# Patient Record
Sex: Female | Born: 1980 | Race: White | Hispanic: No | Marital: Married | Smoking: Current every day smoker
Health system: Southern US, Community
[De-identification: ages and names within clinical notes are randomized; demographics above are authoritative.]

## PROBLEM LIST (undated history)

## (undated) DIAGNOSIS — E119 Type 2 diabetes mellitus without complications: Secondary | ICD-10-CM

## (undated) DIAGNOSIS — R609 Edema, unspecified: Secondary | ICD-10-CM

## (undated) DIAGNOSIS — R5383 Other fatigue: Secondary | ICD-10-CM

## (undated) HISTORY — PX: OTHER SURGICAL HISTORY: SHX169

## (undated) HISTORY — DX: Edema, unspecified: R60.9

## (undated) HISTORY — DX: Other fatigue: R53.83

## (undated) HISTORY — PX: CHOLECYSTECTOMY: SHX55

## (undated) HISTORY — DX: Type 2 diabetes mellitus without complications: E11.9

## (undated) HISTORY — PX: HERNIA REPAIR: SHX51

---

## 2009-03-03 HISTORY — PX: MASTECTOMY: SHX3

## 2010-03-03 HISTORY — PX: REDUCTION MAMMAPLASTY: SUR839

## 2013-10-20 ENCOUNTER — Ambulatory Visit: Payer: BC Managed Care – HMO

## 2013-10-20 ENCOUNTER — Ambulatory Visit (INDEPENDENT_AMBULATORY_CARE_PROVIDER_SITE_OTHER): Payer: BC Managed Care – HMO

## 2013-10-20 VITALS — BP 119/84 | HR 88 | Resp 18

## 2013-10-20 DIAGNOSIS — M7731 Calcaneal spur, right foot: Secondary | ICD-10-CM

## 2013-10-20 DIAGNOSIS — M722 Plantar fascial fibromatosis: Secondary | ICD-10-CM

## 2013-10-20 DIAGNOSIS — M7730 Calcaneal spur, unspecified foot: Secondary | ICD-10-CM

## 2013-10-20 DIAGNOSIS — R52 Pain, unspecified: Secondary | ICD-10-CM

## 2013-10-20 NOTE — Progress Notes (Signed)
   Subjective:    Patient ID: Candace Edwards, female    DOB: 04/15/1980, 33 y.o.   MRN: [id number]  HPI I AM A DIABETIC AND HAVE BEEN SINCE January OF THIS YEAR AND MY RIGHT HEEL IS HURTING ME WHEN I GET UP AND HAS BEEN GOING ON SINCE MAY AND I DO WALK AND STARTED RUNNING AND BURNS AND THROBS AND HURTS ON THE BOTTOM AND IS SORE AND TENDER AND I GOT NEW SHOES    Review of Systems  Musculoskeletal:       Muscle pain   All other systems reviewed and are negative.      Objective:   Physical Exam [age] year old white female well-developed well-nourished oriented x3 presents at this time with a several month history of increasing pain inferior heel pain on first up in the morning or getting up a period of rest and aggravated when she started an exercise program and worsen worn out athletic shoes.  Lower extremity objective findings revealed vascular status intact pedal pulses are palpable epicritic and proprioceptive sensations intact and symmetric bilateral is normal plantar response DTRs not listed neurologically skin color pigment normal hair growth absent nails unremarkable. On exam there is pain on palpation of the medial band of the plantar fascia medial calcaneal tubercle on the right heel x-rays reveal thickening plantar fascia well-developed inferior calcaneal spur no fracture no cysts no other osseous abnormality noted      Assessment & Plan:  Assessment plantar fasciitis/heel spur syndrome right plantar fascial strapping is applied patient will continue with the meloxicam her MOBIC which she's currently take also recommended ice to the heel every evening maintain good stable shoe suggested crocs for around the house no barefoot or flimsy shoes or flip-flops recheck in 2 weeks for followup may be candidate for functional orthoses based on progress  Candace Edwards DPM

## 2013-10-20 NOTE — Patient Instructions (Signed)

## 2013-11-03 ENCOUNTER — Ambulatory Visit (INDEPENDENT_AMBULATORY_CARE_PROVIDER_SITE_OTHER): Payer: BC Managed Care – HMO

## 2013-11-03 VITALS — BP 112/77 | HR 94 | Resp 18

## 2013-11-03 DIAGNOSIS — M7730 Calcaneal spur, unspecified foot: Secondary | ICD-10-CM

## 2013-11-03 DIAGNOSIS — M7731 Calcaneal spur, right foot: Secondary | ICD-10-CM

## 2013-11-03 DIAGNOSIS — M722 Plantar fascial fibromatosis: Secondary | ICD-10-CM

## 2013-11-03 NOTE — Patient Instructions (Signed)

## 2013-11-03 NOTE — Progress Notes (Signed)
° °  Subjective:    Patient ID: Candace Edwards, female    DOB: 04/20/1980, 33 y.o.   MRN: [id number]  HPI I WAS BETTER WHEN MY RIGHT FOOT WAS TAPED AND THEN WHEN IT CAME OFF IT WENT BACK TO BEING SORE AND TENDER AND IF I HAVE INSERTS MADE I WOULD LIKE TO HAVE THEM MADE AT [facility] IN [place]    Review of Systems no new findings or systemic changes noted     Objective:   Physical Exam Continues to have pain in the inferior heel area and better with taping was in place it was taken back to hurting painful tender and symptomatic like it had been. Has had to cut back on her exercise program her basal effective the taping has significant improvement on place she is strong K. for orthoses at this time prescription for orthotics for [place] [facility] is given to the patient will do a sports [employer]fer plate type orthotic with Spenco top cover for distention and extrinsic heel post.      Assessment & Plan:  Assessment plantar fasciitis/heel spur syndrome responded to taping orthotic prescription is given fascial strapping reapplied at this time for temporary relief maintain NSAID therapy in the interim followup with in 2 or 3 months if still needed or symptoms persist  Alvan Dame DPM

## 2013-11-15 ENCOUNTER — Telehealth: Payer: Self-pay | Admitting: *Deleted

## 2013-11-15 NOTE — Telephone Encounter (Signed)
Sent an order to be signed for foot inserts, removable, molded to patient model, longitudinal/ metatarsal support, each.  Length of time is lifetime.  Diagnosis is Plantar Fascial Fibromatosis.  The code is L3020.    Dr. Ralene Cork signed the order and it was faxed back.

## 2015-05-31 ENCOUNTER — Encounter: Payer: Self-pay | Admitting: Sports Medicine

## 2015-05-31 ENCOUNTER — Ambulatory Visit (INDEPENDENT_AMBULATORY_CARE_PROVIDER_SITE_OTHER): Payer: BLUE CROSS/BLUE SHIELD | Admitting: Sports Medicine

## 2015-05-31 DIAGNOSIS — M79671 Pain in right foot: Secondary | ICD-10-CM

## 2015-05-31 DIAGNOSIS — E119 Type 2 diabetes mellitus without complications: Secondary | ICD-10-CM

## 2015-05-31 DIAGNOSIS — Q828 Other specified congenital malformations of skin: Secondary | ICD-10-CM

## 2015-05-31 NOTE — Patient Instructions (Addendum)
Diabetes and Foot Care Diabetes may cause you to have problems because of poor blood supply (circulation) to your feet and legs. This may cause the skin on your feet to become thinner, break easier, and heal more slowly. Your skin may become dry, and the skin may peel and crack. You may also have nerve damage in your legs and feet causing decreased feeling in them. You may not notice minor injuries to your feet that could lead to infections or more serious problems. Taking care of your feet is one of the most important things you can do for yourself.  HOME CARE INSTRUCTIONS  Wear shoes at all times, even in the house. Do not go barefoot. Bare feet are easily injured.  Check your feet daily for blisters, cuts, and redness. If you cannot see the bottom of your feet, use a mirror or ask someone for help.  Wash your feet with warm water (do not use hot water) and mild soap. Then pat your feet and the areas between your toes until they are completely dry. Do not soak your feet as this can dry your skin.  Apply a moisturizing lotion or petroleum jelly (that does not contain alcohol and is unscented) to the skin on your feet and to dry, brittle toenails. Do not apply lotion between your toes.  Trim your toenails straight across. Do not dig under them or around the cuticle. File the edges of your nails with an emery board or nail file.  Do not cut corns or calluses or try to remove them with medicine.  Wear clean socks or stockings every day. Make sure they are not too tight. Do not wear knee-high stockings since they may decrease blood flow to your legs.  Wear shoes that fit properly and have enough cushioning. To break in new shoes, wear them for just a few hours a day. This prevents you from injuring your feet. Always look in your shoes before you put them on to be sure there are no objects inside.  Do not cross your legs. This may decrease the blood flow to your feet.  If you find a minor scrape,  cut, or break in the skin on your feet, keep it and the skin around it clean and dry. These areas may be cleansed with mild soap and water. Do not cleanse the area with peroxide, alcohol, or iodine.  When you remove an adhesive bandage, be sure not to damage the skin around it.  If you have a wound, look at it several times a day to make sure it is healing.  Do not use heating pads or hot water bottles. They may burn your skin. If you have lost feeling in your feet or legs, you may not know it is happening until it is too late.  Make sure your health care provider performs a complete foot exam at least annually or more often if you have foot problems. Report any cuts, sores, or bruises to your health care provider immediately. SEEK MEDICAL CARE IF:   You have an injury that is not healing.  You have cuts or breaks in the skin.  You have an ingrown nail.  You notice redness on your legs or feet.  You feel burning or tingling in your legs or feet.  You have pain or cramps in your legs and feet.  Your legs or feet are numb.  Your feet always feel cold. SEEK IMMEDIATE MEDICAL CARE IF:   There is increasing redness,   swelling, or pain in or around a wound.  There is a red line that goes up your leg.  Pus is coming from a wound.  You develop a fever or as directed by your health care provider.  You notice a bad smell coming from an ulcer or wound.   This information is not intended to replace advice given to you by your health care provider. Make sure you discuss any questions you have with your health care provider.   Document Released: 02/15/2000 Document Revised: 10/20/2012 Document Reviewed: 07/27/2012 Elsevier Interactive Patient Education 2016 [employer]  Okeeffe's healthy feet cream daily for dry skin  Good supportive shoes daily

## 2015-05-31 NOTE — Progress Notes (Signed)
Patient ID: Candace Edwards, female   DOB: 10/07/1980, 35 y.o.   MRN: [id number] Subjective: Candace Edwards is a 35 y.o. Diabetic female patient who presents to office for evaluation of Right foot pain. Patient complains of pain at the lesion present Right foot at the bottom; reports that the pain is sharp to the area and then also throbbing. States that she had similar lesion last year that was shaved by Dr. Ralene CorkSikora. Admits that she tried trimming it herself but it came back. Patient denies any other pedal complaints.   FBS ~120  There are no active problems to display for this patient.   Current Outpatient Prescriptions on File Prior to Visit  Medication Sig Dispense Refill  . azithromycin (ZITHROMAX) 250 MG tablet     . docusate sodium (COLACE) 100 MG capsule Take 100 mg by mouth 2 (two) times daily.    . meloxicam (MOBIC) 15 MG tablet Take 15 mg by mouth daily.    . metFORMIN (GLUCOPHAGE) 1000 MG tablet Take 1,000 mg by mouth 2 (two) times daily with a meal.    . Multiple Vitamin (MULTIVITAMIN) tablet Take 1 tablet by mouth daily. womens daily/lc    . phentermine (ADIPEX-P) 37.5 MG tablet Take 37.5 mg by mouth daily before breakfast.    . Vitamin D, Ergocalciferol, (DRISDOL) 50000 UNITS CAPS capsule Take 50,000 Units by mouth every 7 (seven) days.     No current facility-administered medications on file prior to visit.    Allergies  Allergen Reactions  . Amoxicillin   . Erythromycin   . Levaquin [Levofloxacin In D5w]   . Maxiphen [Phenylephrine-Guaifenesin]   . Penicillins     Objective:  General: Alert and oriented x3 in no acute distress  Dermatology: Keratotic lesion present right plantar lateral foot with central nucleated core noted suggestive of porokeratosis with superimposed dry skin to plantar surfaces of both feet, no webspace macerations, no ecchymosis bilateral, all nails x 10 are well manicured.  Vascular: Dorsalis Pedis and Posterior Tibial pedal pulses 2/4,  Capillary Fill Time 3 seconds, + pedal hair growth bilateral, no edema bilateral lower extremities, Temperature gradient within normal limits.  Neurology: Michaell CowingGross sensation intact via light touch bilateral. Protective and vibratory sensation intact bilateral.  Musculoskeletal: Mild tenderness with palpation at the lesion site on Right, Muscular strength 5/5 in all groups without pain or limitation on range of motion. No symptomatic lower extremity muscular or boney deformity noted.  Assessment and Plan: Problem List Items Addressed This Visit    None    Visit Diagnoses    Porokeratosis    -  Primary    Right foot pain        Diabetes mellitus without complication (HCC)          -Complete examination performed -Discussed treatment options -Parred keratoic lesion using a chisel blade; treated the area with Salinocaine covered with moleskin; Advised patient to remove in one day -Encouraged daily foot inspection and to refrain from using sharp objects to trim on feet in the setting of diabetes -Recommend daily skin emollients O'Keeffes healthy feet -Recommend good supportive shoes and inserts with offloading to the area as needed -Patient to return to office as needed or sooner if condition worsens.  Asencion Islamitorya Flois Mctague, DPM

## 2015-06-10 DIAGNOSIS — J209 Acute bronchitis, unspecified: Secondary | ICD-10-CM | POA: Diagnosis not present

## 2015-06-10 DIAGNOSIS — Z9049 Acquired absence of other specified parts of digestive tract: Secondary | ICD-10-CM | POA: Diagnosis not present

## 2015-06-10 DIAGNOSIS — Z9010 Acquired absence of unspecified breast and nipple: Secondary | ICD-10-CM | POA: Diagnosis not present

## 2015-06-10 DIAGNOSIS — N611 Abscess of the breast and nipple: Secondary | ICD-10-CM | POA: Diagnosis not present

## 2015-06-10 DIAGNOSIS — Z853 Personal history of malignant neoplasm of breast: Secondary | ICD-10-CM | POA: Diagnosis not present

## 2015-06-10 DIAGNOSIS — Z720 Tobacco use: Secondary | ICD-10-CM | POA: Diagnosis not present

## 2015-06-10 DIAGNOSIS — Z880 Allergy status to penicillin: Secondary | ICD-10-CM | POA: Diagnosis not present

## 2015-06-10 DIAGNOSIS — Z79899 Other long term (current) drug therapy: Secondary | ICD-10-CM | POA: Diagnosis not present

## 2015-06-10 DIAGNOSIS — R05 Cough: Secondary | ICD-10-CM | POA: Diagnosis not present

## 2015-06-25 DIAGNOSIS — Z713 Dietary counseling and surveillance: Secondary | ICD-10-CM | POA: Diagnosis not present

## 2015-06-25 DIAGNOSIS — F329 Major depressive disorder, single episode, unspecified: Secondary | ICD-10-CM | POA: Diagnosis not present

## 2015-06-25 DIAGNOSIS — E1165 Type 2 diabetes mellitus with hyperglycemia: Secondary | ICD-10-CM | POA: Diagnosis not present

## 2015-06-25 DIAGNOSIS — B373 Candidiasis of vulva and vagina: Secondary | ICD-10-CM | POA: Diagnosis not present

## 2015-07-10 DIAGNOSIS — D72829 Elevated white blood cell count, unspecified: Secondary | ICD-10-CM | POA: Diagnosis not present

## 2015-07-10 DIAGNOSIS — E782 Mixed hyperlipidemia: Secondary | ICD-10-CM | POA: Diagnosis not present

## 2015-07-10 DIAGNOSIS — E1165 Type 2 diabetes mellitus with hyperglycemia: Secondary | ICD-10-CM | POA: Diagnosis not present

## 2015-07-25 ENCOUNTER — Ambulatory Visit (INDEPENDENT_AMBULATORY_CARE_PROVIDER_SITE_OTHER): Payer: BLUE CROSS/BLUE SHIELD | Admitting: Sports Medicine

## 2015-07-25 ENCOUNTER — Other Ambulatory Visit: Payer: Self-pay | Admitting: Sports Medicine

## 2015-07-25 ENCOUNTER — Encounter: Payer: Self-pay | Admitting: Sports Medicine

## 2015-07-25 DIAGNOSIS — B078 Other viral warts: Secondary | ICD-10-CM | POA: Diagnosis not present

## 2015-07-25 DIAGNOSIS — Q828 Other specified congenital malformations of skin: Secondary | ICD-10-CM

## 2015-07-25 DIAGNOSIS — E119 Type 2 diabetes mellitus without complications: Secondary | ICD-10-CM | POA: Diagnosis not present

## 2015-07-25 DIAGNOSIS — M79671 Pain in right foot: Secondary | ICD-10-CM | POA: Diagnosis not present

## 2015-07-25 NOTE — Progress Notes (Signed)
Patient ID: Candace Edwards, female   DOB: 10/23/1980, 35 y.o.   MRN: [id number]   Subjective: Candace Edwards is a 35 y.o. Diabetic female patient who returns to office for evaluation of Right foot pain secondary to keratosis. Patient states it came back worse. Only got 1 week of relief after last trim. Patient denies any other pedal complaints.   FBS ~120. A1c to be obtained  There are no active problems to display for this patient.   Current Outpatient Prescriptions on File Prior to Visit  Medication Sig Dispense Refill  . azithromycin (ZITHROMAX) 250 MG tablet     . docusate sodium (COLACE) 100 MG capsule Take 100 mg by mouth 2 (two) times daily.    . meloxicam (MOBIC) 15 MG tablet Take 15 mg by mouth daily.    . metFORMIN (GLUCOPHAGE) 1000 MG tablet Take 1,000 mg by mouth 2 (two) times daily with a meal.    . Multiple Vitamin (MULTIVITAMIN) tablet Take 1 tablet by mouth daily. womens daily/lc    . phentermine (ADIPEX-P) 37.5 MG tablet Take 37.5 mg by mouth daily before breakfast.    . Vitamin D, Ergocalciferol, (DRISDOL) 50000 UNITS CAPS capsule Take 50,000 Units by mouth every 7 (seven) days.     No current facility-administered medications on file prior to visit.    Allergies  Allergen Reactions  . Amoxicillin   . Erythromycin   . Levaquin [Levofloxacin In D5w]   . Maxiphen [Phenylephrine-Guaifenesin]   . Penicillins     Objective:  General: Alert and oriented x3 in no acute distress  Dermatology: Keratotic lesion measuring 0.8cm in diameter present right plantar lateral foot with central nucleated core noted suggestive of porokeratosis with superimposed dry skin to plantar surfaces of both feet, no webspace macerations, no ecchymosis bilateral, all nails x 10 are well manicured.  Vascular: Dorsalis Pedis and Posterior Tibial pedal pulses 2/4, Capillary Fill Time 3 seconds, + pedal hair growth bilateral, no edema bilateral lower extremities, Temperature gradient within  normal limits.  Neurology: Michaell CowingGross sensation intact via light touch bilateral. Protective and vibratory sensation intact bilateral.  Musculoskeletal: Moderate tenderness with palpation at the lesion site on Right, Muscular strength 5/5 in all groups without pain or limitation on range of motion. No symptomatic lower extremity muscular or boney deformity noted. Cavovarus foot type bilateral  Assessment and Plan: Problem List Items Addressed This Visit    None    Visit Diagnoses    Right foot pain    -  Primary    Porokeratosis        Diabetes mellitus without complication (HCC)          -Complete examination performed -Discussed treatment options -Patient opt for complete excision. After oral consent 3cc mixture of lidocaine and marcaine was infiltrated at right plantar foot keratosis site then 1cc of lidocane with Epi was used, the area was prepped with betadine and the lesion was sharply excised with 15 blade and sent to pathology ([facility]). Hemostasis was achieved and the area was dressed with topical antibiotic cream, offloading pad, 4x4, coban. -Dispensed post op shoe -Recommend ice, elevation, and Motrin or Aleve for pain -Patient to change dressing starting tomorrow after cleanse/ soak with Epsom salt using neosporin and large bandaid -Advised patient to monitor for signs of infection. If occurs to call or come to office or go to ER immediately -Patient to return to office 1 week check excision site or sooner if condition worsens.  Asencion Islamitorya Triton Heidrich, DPM

## 2015-07-29 DIAGNOSIS — T162XXA Foreign body in left ear, initial encounter: Secondary | ICD-10-CM | POA: Diagnosis not present

## 2015-07-29 DIAGNOSIS — Z79899 Other long term (current) drug therapy: Secondary | ICD-10-CM | POA: Diagnosis not present

## 2015-07-29 DIAGNOSIS — H9202 Otalgia, left ear: Secondary | ICD-10-CM | POA: Diagnosis not present

## 2015-07-29 DIAGNOSIS — E119 Type 2 diabetes mellitus without complications: Secondary | ICD-10-CM | POA: Diagnosis not present

## 2015-07-29 DIAGNOSIS — Z7984 Long term (current) use of oral hypoglycemic drugs: Secondary | ICD-10-CM | POA: Diagnosis not present

## 2015-07-29 DIAGNOSIS — Z720 Tobacco use: Secondary | ICD-10-CM | POA: Diagnosis not present

## 2015-07-29 DIAGNOSIS — Z853 Personal history of malignant neoplasm of breast: Secondary | ICD-10-CM | POA: Diagnosis not present

## 2015-08-01 ENCOUNTER — Encounter: Payer: Self-pay | Admitting: Sports Medicine

## 2015-08-01 ENCOUNTER — Ambulatory Visit (INDEPENDENT_AMBULATORY_CARE_PROVIDER_SITE_OTHER): Payer: BLUE CROSS/BLUE SHIELD | Admitting: Sports Medicine

## 2015-08-01 DIAGNOSIS — Z09 Encounter for follow-up examination after completed treatment for conditions other than malignant neoplasm: Secondary | ICD-10-CM

## 2015-08-01 DIAGNOSIS — M79671 Pain in right foot: Secondary | ICD-10-CM

## 2015-08-01 NOTE — Progress Notes (Signed)
Patient ID: Candace Edwards, female   DOB: 11/10/1980, 35 y.o.   MRN: [id number]  Subjective: Candace Edwards is a 35 y.o. Diabetic female patient who returns to office for follow up eval s/p excision of keratosis right foot; reports that she has been dressing area with antibiotic cream and soaking with Epsom salt as instructed. Patient denies any other pedal complaints.   FBS ~120. A1C to be obtained  There are no active problems to display for this patient.   Current Outpatient Prescriptions on File Prior to Visit  Medication Sig Dispense Refill  . azithromycin (ZITHROMAX) 250 MG tablet     . docusate sodium (COLACE) 100 MG capsule Take 100 mg by mouth 2 (two) times daily.    . meloxicam (MOBIC) 15 MG tablet Take 15 mg by mouth daily.    . metFORMIN (GLUCOPHAGE) 1000 MG tablet Take 1,000 mg by mouth 2 (two) times daily with a meal.    . Multiple Vitamin (MULTIVITAMIN) tablet Take 1 tablet by mouth daily. womens daily/lc    . phentermine (ADIPEX-P) 37.5 MG tablet Take 37.5 mg by mouth daily before breakfast.    . Vitamin D, Ergocalciferol, (DRISDOL) 50000 UNITS CAPS capsule Take 50,000 Units by mouth every 7 (seven) days.     No current facility-administered medications on file prior to visit.    Allergies  Allergen Reactions  . Amoxicillin   . Erythromycin   . Levaquin [Levofloxacin In D5w]   . Maxiphen [Phenylephrine-Guaifenesin]   . Penicillins     Objective:  General: Alert and oriented x3 in no acute distress  Dermatology: Excision site right plantar lateral foot Healing with granular tissue present. Mild surrounding maceration, no erythema, no cellulitis, no edema, no active drainage, no malodor, no acute signs of infection, no webspace macerations, no ecchymosis bilateral, all nails x 10 are well manicured.  Vascular: Dorsalis Pedis and Posterior Tibial pedal pulses 2/4, Capillary Fill Time 3 seconds, + pedal hair growth bilateral, no edema bilateral lower extremities,  Temperature gradient within normal limits.  Neurology: Michaell CowingGross sensation intact via light touch bilateral. Protective and vibratory sensation intact bilateral.  Musculoskeletal: Decreased tenderness with palpation at excision site right foot, Muscular strength 5/5 in all groups without pain or limitation on range of motion. No symptomatic lower extremity muscular or boney deformity noted. Cavovarus foot type bilateral  Path, [facility]   Assessment and Plan: Problem List Items Addressed This Visit    None    Visit Diagnoses    S/P excision of skin lesion, follow-up exam    -  Primary    Right foot pain          -Complete examination performed -Continue with daily foot inspection in the setting of diabetes -Pathology reviewed -Using a sterile chisel blade mechanically removed reactive keratosis and excision site right plantar lateral foot and applied Neosporin and Band-Aid -Patient to continue with the dressing excision site with Neosporin and Band-Aid may be open to air at night -May use offloading pad until sensitivity at excision site is resolved -Recommend to wean from Epsom salt soaks as instructed -Patient to return to office 4 weeks re-check excision site or sooner if condition worsens.  Asencion Islamitorya Zalan Shidler, DPM

## 2015-08-11 DIAGNOSIS — Z7984 Long term (current) use of oral hypoglycemic drugs: Secondary | ICD-10-CM | POA: Diagnosis not present

## 2015-08-11 DIAGNOSIS — L02213 Cutaneous abscess of chest wall: Secondary | ICD-10-CM | POA: Diagnosis not present

## 2015-08-11 DIAGNOSIS — Z6841 Body Mass Index (BMI) 40.0 and over, adult: Secondary | ICD-10-CM | POA: Diagnosis not present

## 2015-08-11 DIAGNOSIS — N611 Abscess of the breast and nipple: Secondary | ICD-10-CM | POA: Diagnosis not present

## 2015-08-11 DIAGNOSIS — Z853 Personal history of malignant neoplasm of breast: Secondary | ICD-10-CM | POA: Diagnosis not present

## 2015-08-11 DIAGNOSIS — Z79899 Other long term (current) drug therapy: Secondary | ICD-10-CM | POA: Diagnosis not present

## 2015-08-11 DIAGNOSIS — E669 Obesity, unspecified: Secondary | ICD-10-CM | POA: Diagnosis not present

## 2015-08-11 DIAGNOSIS — E119 Type 2 diabetes mellitus without complications: Secondary | ICD-10-CM | POA: Diagnosis not present

## 2015-08-11 DIAGNOSIS — F419 Anxiety disorder, unspecified: Secondary | ICD-10-CM | POA: Diagnosis not present

## 2015-08-29 ENCOUNTER — Ambulatory Visit: Payer: BLUE CROSS/BLUE SHIELD | Admitting: Sports Medicine

## 2015-09-03 ENCOUNTER — Encounter: Payer: Self-pay | Admitting: Sports Medicine

## 2015-10-03 DIAGNOSIS — D72829 Elevated white blood cell count, unspecified: Secondary | ICD-10-CM | POA: Diagnosis not present

## 2015-10-03 DIAGNOSIS — E1165 Type 2 diabetes mellitus with hyperglycemia: Secondary | ICD-10-CM | POA: Diagnosis not present

## 2015-10-03 DIAGNOSIS — F329 Major depressive disorder, single episode, unspecified: Secondary | ICD-10-CM | POA: Diagnosis not present

## 2015-10-03 DIAGNOSIS — E782 Mixed hyperlipidemia: Secondary | ICD-10-CM | POA: Diagnosis not present

## 2015-10-03 DIAGNOSIS — R609 Edema, unspecified: Secondary | ICD-10-CM | POA: Diagnosis not present

## 2015-10-22 DIAGNOSIS — K219 Gastro-esophageal reflux disease without esophagitis: Secondary | ICD-10-CM | POA: Diagnosis not present

## 2015-10-22 DIAGNOSIS — C50919 Malignant neoplasm of unspecified site of unspecified female breast: Secondary | ICD-10-CM | POA: Diagnosis not present

## 2015-10-22 DIAGNOSIS — R1031 Right lower quadrant pain: Secondary | ICD-10-CM | POA: Diagnosis not present

## 2015-10-22 DIAGNOSIS — E119 Type 2 diabetes mellitus without complications: Secondary | ICD-10-CM | POA: Diagnosis not present

## 2015-10-22 DIAGNOSIS — K37 Unspecified appendicitis: Secondary | ICD-10-CM | POA: Diagnosis not present

## 2015-10-22 DIAGNOSIS — K3580 Unspecified acute appendicitis: Secondary | ICD-10-CM | POA: Diagnosis not present

## 2015-10-23 DIAGNOSIS — K37 Unspecified appendicitis: Secondary | ICD-10-CM | POA: Diagnosis not present

## 2015-10-23 DIAGNOSIS — K219 Gastro-esophageal reflux disease without esophagitis: Secondary | ICD-10-CM | POA: Diagnosis not present

## 2015-10-23 DIAGNOSIS — Z79899 Other long term (current) drug therapy: Secondary | ICD-10-CM | POA: Diagnosis not present

## 2015-10-23 DIAGNOSIS — E119 Type 2 diabetes mellitus without complications: Secondary | ICD-10-CM | POA: Diagnosis not present

## 2015-10-23 DIAGNOSIS — K353 Acute appendicitis with localized peritonitis, without perforation or gangrene: Secondary | ICD-10-CM | POA: Insufficient documentation

## 2015-10-23 DIAGNOSIS — I4510 Unspecified right bundle-branch block: Secondary | ICD-10-CM | POA: Diagnosis not present

## 2015-10-23 DIAGNOSIS — Z853 Personal history of malignant neoplasm of breast: Secondary | ICD-10-CM | POA: Diagnosis not present

## 2015-10-23 DIAGNOSIS — K3580 Unspecified acute appendicitis: Secondary | ICD-10-CM | POA: Diagnosis not present

## 2015-10-23 DIAGNOSIS — R9431 Abnormal electrocardiogram [ECG] [EKG]: Secondary | ICD-10-CM | POA: Diagnosis not present

## 2015-10-23 DIAGNOSIS — R1031 Right lower quadrant pain: Secondary | ICD-10-CM | POA: Diagnosis not present

## 2015-10-23 DIAGNOSIS — Z7984 Long term (current) use of oral hypoglycemic drugs: Secondary | ICD-10-CM | POA: Diagnosis not present

## 2015-10-24 DIAGNOSIS — K353 Acute appendicitis with localized peritonitis: Secondary | ICD-10-CM | POA: Diagnosis not present

## 2015-10-24 DIAGNOSIS — K219 Gastro-esophageal reflux disease without esophagitis: Secondary | ICD-10-CM | POA: Diagnosis not present

## 2015-10-24 DIAGNOSIS — Z7984 Long term (current) use of oral hypoglycemic drugs: Secondary | ICD-10-CM | POA: Diagnosis not present

## 2015-10-24 DIAGNOSIS — Z853 Personal history of malignant neoplasm of breast: Secondary | ICD-10-CM | POA: Diagnosis not present

## 2015-10-24 DIAGNOSIS — Z79899 Other long term (current) drug therapy: Secondary | ICD-10-CM | POA: Diagnosis not present

## 2015-10-24 DIAGNOSIS — E119 Type 2 diabetes mellitus without complications: Secondary | ICD-10-CM | POA: Diagnosis not present

## 2015-11-09 DIAGNOSIS — K219 Gastro-esophageal reflux disease without esophagitis: Secondary | ICD-10-CM | POA: Diagnosis not present

## 2015-11-09 DIAGNOSIS — E1165 Type 2 diabetes mellitus with hyperglycemia: Secondary | ICD-10-CM | POA: Diagnosis not present

## 2015-11-09 DIAGNOSIS — G4730 Sleep apnea, unspecified: Secondary | ICD-10-CM | POA: Diagnosis not present

## 2015-11-09 DIAGNOSIS — F329 Major depressive disorder, single episode, unspecified: Secondary | ICD-10-CM | POA: Diagnosis not present

## 2015-11-27 DIAGNOSIS — G478 Other sleep disorders: Secondary | ICD-10-CM | POA: Diagnosis not present

## 2015-11-27 DIAGNOSIS — G4733 Obstructive sleep apnea (adult) (pediatric): Secondary | ICD-10-CM | POA: Diagnosis not present

## 2015-11-30 DIAGNOSIS — F329 Major depressive disorder, single episode, unspecified: Secondary | ICD-10-CM | POA: Diagnosis not present

## 2015-11-30 DIAGNOSIS — J40 Bronchitis, not specified as acute or chronic: Secondary | ICD-10-CM | POA: Diagnosis not present

## 2015-12-12 DIAGNOSIS — G47419 Narcolepsy without cataplexy: Secondary | ICD-10-CM | POA: Diagnosis not present

## 2015-12-21 DIAGNOSIS — E1165 Type 2 diabetes mellitus with hyperglycemia: Secondary | ICD-10-CM | POA: Diagnosis not present

## 2015-12-21 DIAGNOSIS — D72829 Elevated white blood cell count, unspecified: Secondary | ICD-10-CM | POA: Diagnosis not present

## 2015-12-21 DIAGNOSIS — F329 Major depressive disorder, single episode, unspecified: Secondary | ICD-10-CM | POA: Diagnosis not present

## 2015-12-21 DIAGNOSIS — E782 Mixed hyperlipidemia: Secondary | ICD-10-CM | POA: Diagnosis not present

## 2016-01-11 DIAGNOSIS — Z1231 Encounter for screening mammogram for malignant neoplasm of breast: Secondary | ICD-10-CM | POA: Diagnosis not present

## 2016-01-17 DIAGNOSIS — E1165 Type 2 diabetes mellitus with hyperglycemia: Secondary | ICD-10-CM | POA: Diagnosis not present

## 2016-01-17 DIAGNOSIS — E782 Mixed hyperlipidemia: Secondary | ICD-10-CM | POA: Diagnosis not present

## 2016-01-17 DIAGNOSIS — D72829 Elevated white blood cell count, unspecified: Secondary | ICD-10-CM | POA: Diagnosis not present

## 2016-01-17 DIAGNOSIS — Z209 Contact with and (suspected) exposure to unspecified communicable disease: Secondary | ICD-10-CM | POA: Diagnosis not present

## 2016-01-29 DIAGNOSIS — E782 Mixed hyperlipidemia: Secondary | ICD-10-CM | POA: Diagnosis not present

## 2016-01-29 DIAGNOSIS — E1165 Type 2 diabetes mellitus with hyperglycemia: Secondary | ICD-10-CM | POA: Diagnosis not present

## 2016-01-29 DIAGNOSIS — Z209 Contact with and (suspected) exposure to unspecified communicable disease: Secondary | ICD-10-CM | POA: Diagnosis not present

## 2016-01-29 DIAGNOSIS — A530 Latent syphilis, unspecified as early or late: Secondary | ICD-10-CM | POA: Diagnosis not present

## 2016-02-13 DIAGNOSIS — C50911 Malignant neoplasm of unspecified site of right female breast: Secondary | ICD-10-CM | POA: Diagnosis not present

## 2016-02-19 DIAGNOSIS — E1165 Type 2 diabetes mellitus with hyperglycemia: Secondary | ICD-10-CM | POA: Diagnosis not present

## 2016-02-19 DIAGNOSIS — Z6841 Body Mass Index (BMI) 40.0 and over, adult: Secondary | ICD-10-CM | POA: Diagnosis not present

## 2016-02-19 DIAGNOSIS — Z0000 Encounter for general adult medical examination without abnormal findings: Secondary | ICD-10-CM | POA: Diagnosis not present

## 2016-03-05 DIAGNOSIS — Z6841 Body Mass Index (BMI) 40.0 and over, adult: Secondary | ICD-10-CM | POA: Diagnosis not present

## 2016-03-05 DIAGNOSIS — Z01419 Encounter for gynecological examination (general) (routine) without abnormal findings: Secondary | ICD-10-CM | POA: Diagnosis not present

## 2016-03-28 DIAGNOSIS — Z6841 Body Mass Index (BMI) 40.0 and over, adult: Secondary | ICD-10-CM | POA: Diagnosis not present

## 2016-03-28 DIAGNOSIS — R51 Headache: Secondary | ICD-10-CM | POA: Diagnosis not present

## 2016-05-09 ENCOUNTER — Telehealth: Payer: Self-pay | Admitting: *Deleted

## 2016-05-09 ENCOUNTER — Encounter: Payer: Self-pay | Admitting: Sports Medicine

## 2016-05-09 ENCOUNTER — Ambulatory Visit (INDEPENDENT_AMBULATORY_CARE_PROVIDER_SITE_OTHER): Payer: BLUE CROSS/BLUE SHIELD | Admitting: Sports Medicine

## 2016-05-09 DIAGNOSIS — E119 Type 2 diabetes mellitus without complications: Secondary | ICD-10-CM | POA: Diagnosis not present

## 2016-05-09 DIAGNOSIS — L84 Corns and callosities: Secondary | ICD-10-CM

## 2016-05-09 DIAGNOSIS — M79671 Pain in right foot: Secondary | ICD-10-CM

## 2016-05-09 DIAGNOSIS — B070 Plantar wart: Secondary | ICD-10-CM | POA: Diagnosis not present

## 2016-05-09 NOTE — Telephone Encounter (Signed)
"  I'm calling to schedule my surgery date and time.  Give me a call back."  "I was calling to schedule my surgery please give me a call back."

## 2016-05-09 NOTE — Progress Notes (Signed)
Patient ID: Candace Edwards, female   DOB: 29-Nov-1980, 36 y.o.   MRN: [id number]  Subjective: Candace Edwards is a 36 y.o. Diabetic female patient who returns to office for follow up eval s/p excision of verrucae right foot, last May; reports that the lesion has come back and is painful. Also admits to new callus on left. Patient denies any other pedal complaints.    A1C 6.8 per patient    There are no active problems to display for this patient.   Current Outpatient Prescriptions on File Prior to Visit  Medication Sig Dispense Refill  . azithromycin (ZITHROMAX) 250 MG tablet     . docusate sodium (COLACE) 100 MG capsule Take 100 mg by mouth 2 (two) times daily.    . meloxicam (MOBIC) 15 MG tablet Take 15 mg by mouth daily.    . metFORMIN (GLUCOPHAGE) 1000 MG tablet Take 1,000 mg by mouth 2 (two) times daily with a meal.    . Multiple Vitamin (MULTIVITAMIN) tablet Take 1 tablet by mouth daily. womens daily/lc    . phentermine (ADIPEX-P) 37.5 MG tablet Take 37.5 mg by mouth daily before breakfast.    . Vitamin D, Ergocalciferol, (DRISDOL) 50000 UNITS CAPS capsule Take 50,000 Units by mouth every 7 (seven) days.     No current facility-administered medications on file prior to visit.     Allergies  Allergen Reactions  . Amoxicillin   . Erythromycin   . Levaquin [Levofloxacin In D5w]   . Maxiphen [Phenylephrine-Guaifenesin]   . Penicillins     Objective:  General: Alert and oriented x3 in no acute distress  Dermatology: Skin lesion with keratosis right plantar lateral foot resembling wart with no surrounding maceration, no erythema, no cellulitis, no edema, no active drainage, no malodor, no acute signs of infection, no webspace macerations, no ecchymosis bilateral, mild callus left sub met 2 and 5 nonpainful, all nails x 10 are well manicured.  Vascular: Dorsalis Pedis and Posterior Tibial pedal pulses 2/4, Capillary Fill Time 3 seconds, + pedal hair growth bilateral, no edema  bilateral lower extremities, Temperature gradient within normal limits.  Neurology: Johney Maine sensation intact via light touch bilateral. Protective and vibratory sensation intact bilateral.  Musculoskeletal:There is tenderness with palpation at excision site/recurred wart right foot, No tenderness to left foot. Muscular strength 5/5 in all groups without pain or limitation on range of motion. No symptomatic lower extremity muscular or boney deformity noted. Cavovarus foot type bilateral  Path, 07/2015 [facility]   Assessment and Plan: Problem List Items Addressed This Visit    None    Visit Diagnoses    Plantar wart of right foot    -  Primary   Right foot pain       Diabetes mellitus without complication (HCC)       Callus of foot       left sub 2 and sub met 5     -Complete examination performed -Discussed treatment options for recurred wart on right -Using a sterile chisel blade mechanically removed reactive keratosis at  right plantar lateral foot and applied canthrone and advised of blister reaction that may occur and to cover daily with neosporin and bandaid until resolved -May use offloading pad until sensitivity at wart site is resolved -Patient opt for surgical management. Consent obtained for Excision of wart right foot. Pre and Post op course explained. Risks, benefits, alternatives explained. No guarantees given or implied. Surgical booking slip submitted and provided patient with Surgical packet and  info for [facility] surgical center. -Dispensed surgical shoe to use post op; Recommend at minimum 2 weeks no work. 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  Landis Martins, DPM

## 2016-05-09 NOTE — Patient Instructions (Signed)
Pre-Operative Instructions  Congratulations, you have decided to take an important step to improving your quality of life.  You can be assured that the doctors of [facility] will be with you every step of the way.  1. Plan to be at the surgery center/hospital at least 1 (one) hour prior to your scheduled time unless otherwise directed by the surgical center/hospital staff.  You must have a responsible adult accompany you, remain during the surgery and drive you home.  Make sure you have directions to the surgical center/hospital and know how to get there on time. 2. For hospital based surgery you will need to obtain a history and physical form from your family physician within 1 month prior to the date of surgery- we will give you a form for you primary physician.  3. We make every effort to accommodate the date you request for surgery.  There are however, times where surgery dates or times have to be moved.  We will contact you as soon as possible if a change in schedule is required.   4. No Aspirin/Ibuprofen for one week before surgery.  If you are on aspirin, any non-steroidal anti-inflammatory medications (Mobic, Aleve, Ibuprofen) you should stop taking it 7 days prior to your surgery.  You make take Tylenol  For pain prior to surgery.  5. Medications- If you are taking daily heart and blood pressure medications, seizure, reflux, allergy, asthma, anxiety, pain or diabetes medications, make sure the surgery center/hospital is aware before the day of surgery so they may notify you which medications to take or avoid the day of surgery. 6. No food or drink after midnight the night before surgery unless directed otherwise by surgical center/hospital staff. 7. No alcoholic beverages 24 hours prior to surgery.  No smoking 24 hours prior to or 24 hours after surgery. 8. Wear loose pants or shorts- loose enough to fit over bandages, boots, and casts. 9. No slip on shoes, sneakers are best. 10. Bring  your boot with you to the surgery center/hospital.  Also bring crutches or a walker if your physician has prescribed it for you.  If you do not have this equipment, it will be provided for you after surgery. 11. If you have not been contracted by the surgery center/hospital by the day before your surgery, call to confirm the date and time of your surgery. 12. Leave-time from work may vary depending on the type of surgery you have.  Appropriate arrangements should be made prior to surgery with your employer. 13. Prescriptions will be provided immediately following surgery by your doctor.  Have these filled as soon as possible after surgery and take the medication as directed. 14. Remove nail polish on the operative foot. 15. Wash the night before surgery.  The night before surgery wash the foot and leg well with the antibacterial soap provided and water paying special attention to beneath the toenails and in between the toes.  Rinse thoroughly with water and dry well with a towel.  Perform this wash unless told not to do so by your physician.  Enclosed: 1 Ice pack (please put in freezer the night before surgery)   1 Hibiclens skin cleaner   Pre-op Instructions  If you have any questions regarding the instructions, do not hesitate to call our office.  [place]: [address] [place], [place] [address] [phone]  [place]: [address] [place], [place] [address] [phone]  [place]: [address]  [place],  [address] [phone]   Dr.   Norman Regal DPM, Dr. Matthew Wagoner DPM, Dr. M. Todd Hyatt DPM, Dr. Taeveon Keesling DPM 

## 2016-05-12 NOTE — Telephone Encounter (Signed)
"  I was seen in the [facility] in [place].  I was trying to schedule surgery.  I called on Friday a couple of times and I haven't heard anything back."

## 2016-05-12 NOTE — Telephone Encounter (Signed)
I called and informed patient that I would have to call [facility] to see what they have available.  I'll give her a call back tomorrow.  She said it's an excision of wart.

## 2016-05-13 NOTE — Telephone Encounter (Addendum)
I left patient a message to give me a call back.  Calling to get a date.  Surgery can be done on March 26.  "I'm returning your call."  I was calling to see if you can do it on March 26.  "No, it will have to be next week.  My boss is going out of town the following week."  Okay, I'll call you back.  Let me call the surgery center.   I'm calling to let you know, I scheduled you for March 23 at lunchtime.  "Okay what time will it be?"  Someone from the surgical center will call you with the arrival time.  Have you had a physical within the past 30 days?  No, it hasn't been within 30 days.  I will try and get that scheduled tomorrow."  "Sorry to keep bothering you.  I can't have surgery that date.  It will not give me two weeks to recuperate.  I will have to hold off until April 9."  I will call and see if it is available and call you back."  I scheduled your surgery for April 9.  "What time will it be?  Someone from the surgical center will call you with a time the Friday before.  "Okay, sorry to be such a bother."

## 2016-05-22 DIAGNOSIS — R062 Wheezing: Secondary | ICD-10-CM | POA: Diagnosis not present

## 2016-05-22 DIAGNOSIS — Z6841 Body Mass Index (BMI) 40.0 and over, adult: Secondary | ICD-10-CM | POA: Diagnosis not present

## 2016-05-22 DIAGNOSIS — B070 Plantar wart: Secondary | ICD-10-CM | POA: Diagnosis not present

## 2016-05-26 DIAGNOSIS — E1165 Type 2 diabetes mellitus with hyperglycemia: Secondary | ICD-10-CM | POA: Diagnosis not present

## 2016-05-26 DIAGNOSIS — E782 Mixed hyperlipidemia: Secondary | ICD-10-CM | POA: Diagnosis not present

## 2016-05-26 DIAGNOSIS — D72829 Elevated white blood cell count, unspecified: Secondary | ICD-10-CM | POA: Diagnosis not present

## 2016-06-04 ENCOUNTER — Telehealth: Payer: Self-pay | Admitting: *Deleted

## 2016-06-04 NOTE — Telephone Encounter (Signed)
I can do Thursday possibly during lunch at hospital.  I can't do Wedn or Friday because of meetings/rep lunches next week in office on those days -Dr. Marylene Land

## 2016-06-04 NOTE — Telephone Encounter (Signed)
"  She is scheduled for surgery on Monday.  We cannot do her here because her BMI is too high.  Our cut off is 44 and she is above this.  She will have to be done at the hospital.  Can you check with Dr. Marylene Land and let me know if there is another day next week that she can do the surgery?"

## 2016-06-05 DIAGNOSIS — D72829 Elevated white blood cell count, unspecified: Secondary | ICD-10-CM | POA: Diagnosis not present

## 2016-06-05 DIAGNOSIS — E782 Mixed hyperlipidemia: Secondary | ICD-10-CM | POA: Diagnosis not present

## 2016-06-05 DIAGNOSIS — E1165 Type 2 diabetes mellitus with hyperglycemia: Secondary | ICD-10-CM | POA: Diagnosis not present

## 2016-06-05 NOTE — Telephone Encounter (Signed)
Dr. Marylene Land said she can do her surgery on Thursday, April 12 around lunch time.  "Okay, I'll put her on the schedule."    I am calling to let you know we are going to have to move your surgery from [facility] to [facility] due to your BMI being over 44.  It cannot be done on Monday.  Is there anyway you can do it on Thursday.  "I am not sure.  My husband took off work on Monday and my FMLA starts on Monday.  I'm going to have to call you back, I need to talk to my husband.  "Okay, I will be able to do it on Thursday.  I have FMLA papers that need to be filled out the day of surgery by Dr. Marylene Land. She does it then right?"  No, you can actually bring the forms to the [place] office.  They will send the forms to Marylu Lund, she will complete them and send them to designated person.  "That is even better.  I thought I was going to have to do a lot of running to get this taken care of.  I will drop it by the [facility] office today when I go for my doctor's appointment.

## 2016-06-11 ENCOUNTER — Telehealth: Payer: Self-pay | Admitting: *Deleted

## 2016-06-11 NOTE — Telephone Encounter (Signed)
"  I'm scheduled for surgery tomorrow and I haven't heard anything about what time I need to be there or anything."  I can give you a number to call Ms. Alona Bene.  "I don't have anything to write the number down.  Can you text it to me?"  No, I will call Ms. Alona Bene at the surgical center and see what time it will be and give you a call back.  Your surgery starts at 12:00 pm but I don't know what time you need to arrive.   I called and informed Ms. Alona Bene.  She said she would get someone from [facility] to give patient a call.

## 2016-06-12 ENCOUNTER — Encounter: Payer: Self-pay | Admitting: Sports Medicine

## 2016-06-12 DIAGNOSIS — Z6841 Body Mass Index (BMI) 40.0 and over, adult: Secondary | ICD-10-CM | POA: Diagnosis not present

## 2016-06-12 DIAGNOSIS — B078 Other viral warts: Secondary | ICD-10-CM | POA: Diagnosis not present

## 2016-06-12 DIAGNOSIS — B070 Plantar wart: Secondary | ICD-10-CM | POA: Diagnosis not present

## 2016-06-12 DIAGNOSIS — E669 Obesity, unspecified: Secondary | ICD-10-CM | POA: Diagnosis not present

## 2016-06-12 DIAGNOSIS — E119 Type 2 diabetes mellitus without complications: Secondary | ICD-10-CM | POA: Diagnosis not present

## 2016-06-12 DIAGNOSIS — F17210 Nicotine dependence, cigarettes, uncomplicated: Secondary | ICD-10-CM | POA: Diagnosis not present

## 2016-06-12 DIAGNOSIS — Z01810 Encounter for preprocedural cardiovascular examination: Secondary | ICD-10-CM | POA: Diagnosis not present

## 2016-06-12 DIAGNOSIS — Z7984 Long term (current) use of oral hypoglycemic drugs: Secondary | ICD-10-CM | POA: Diagnosis not present

## 2016-06-12 DIAGNOSIS — Z79899 Other long term (current) drug therapy: Secondary | ICD-10-CM | POA: Diagnosis not present

## 2016-06-18 ENCOUNTER — Encounter: Payer: BLUE CROSS/BLUE SHIELD | Admitting: Sports Medicine

## 2016-06-20 ENCOUNTER — Ambulatory Visit (INDEPENDENT_AMBULATORY_CARE_PROVIDER_SITE_OTHER): Payer: BLUE CROSS/BLUE SHIELD | Admitting: Sports Medicine

## 2016-06-20 ENCOUNTER — Encounter: Payer: Self-pay | Admitting: Sports Medicine

## 2016-06-20 VITALS — BP 121/74 | HR 89 | Resp 18

## 2016-06-20 DIAGNOSIS — Z09 Encounter for follow-up examination after completed treatment for conditions other than malignant neoplasm: Secondary | ICD-10-CM

## 2016-06-20 DIAGNOSIS — D72829 Elevated white blood cell count, unspecified: Secondary | ICD-10-CM | POA: Diagnosis not present

## 2016-06-20 DIAGNOSIS — M79671 Pain in right foot: Secondary | ICD-10-CM

## 2016-06-20 NOTE — Progress Notes (Signed)
Subjective: Candace Edwards is a 36 y.o. female patient seen today in office for POV #1 (DOS 06-12-16, S/P excision of benign lesion right foot. Patient denies pain at surgical site, denies calf pain, denies headache, chest pain, shortness of breath, nausea, vomiting, fever, or chills. Patient states that she is doing well and only took 2 pain pills. No other issues noted.   There are no active problems to display for this patient.   Current Outpatient Prescriptions on File Prior to Visit  Medication Sig Dispense Refill  . azithromycin (ZITHROMAX) 250 MG tablet     . docusate sodium (COLACE) 100 MG capsule Take 100 mg by mouth 2 (two) times daily.    . meloxicam (MOBIC) 15 MG tablet Take 15 mg by mouth daily.    . metFORMIN (GLUCOPHAGE) 1000 MG tablet Take 1,000 mg by mouth 2 (two) times daily with a meal.    . Multiple Vitamin (MULTIVITAMIN) tablet Take 1 tablet by mouth daily. womens daily/lc    . phentermine (ADIPEX-P) 37.5 MG tablet Take 37.5 mg by mouth daily before breakfast.    . Vitamin D, Ergocalciferol, (DRISDOL) 50000 UNITS CAPS capsule Take 50,000 Units by mouth every 7 (seven) days.     No current facility-administered medications on file prior to visit.     Allergies  Allergen Reactions  . Amoxicillin   . Erythromycin   . Levaquin [Levofloxacin In D5w]   . Maxiphen [Phenylephrine-Guaifenesin]   . Penicillins     Objective: There were no vitals filed for this visit.  General: No acute distress, AAOx3  Right foot: Sutures intact with central granular tissue from planned tight closure with no new gapping or dehiscence at surgical site, + moderate maceration, mild swelling to right plantar foot, no erythema, no warmth, no drainage, no signs of infection noted, Capillary fill time <3 seconds in all digits, gross sensation present via light touch to right foot. No pain or crepitation with range of motion right foot.  No pain with calf compression.    Assessment and Plan:   Problem List Items Addressed This Visit    None    Visit Diagnoses    S/P excision of skin lesion, follow-up exam    -  Primary   Right foot pain           -Patient seen and evaluated -Applied dry sterile dressing to surgical site right foot secured with ACE wrap and stockinet  -Advised patient to make sure to keep dressings clean, dry, and intact to right surgical site, removing the ACE as needed  -Advised patient to continue with post-op shoe on right foot   -Advised patient to limit activity to necessity  -Advised patient to ice and elevate as necessary  -Will plan for suture removal at next office visit. In the meantime, patient to call office if any issues or problems arise.   Asencion Islam, DPM

## 2016-06-25 ENCOUNTER — Encounter: Payer: BLUE CROSS/BLUE SHIELD | Admitting: Sports Medicine

## 2016-06-26 ENCOUNTER — Ambulatory Visit (INDEPENDENT_AMBULATORY_CARE_PROVIDER_SITE_OTHER): Payer: Self-pay | Admitting: Sports Medicine

## 2016-06-26 ENCOUNTER — Encounter: Payer: Self-pay | Admitting: Sports Medicine

## 2016-06-26 DIAGNOSIS — Z6841 Body Mass Index (BMI) 40.0 and over, adult: Secondary | ICD-10-CM | POA: Diagnosis not present

## 2016-06-26 DIAGNOSIS — D72829 Elevated white blood cell count, unspecified: Secondary | ICD-10-CM | POA: Diagnosis not present

## 2016-06-26 DIAGNOSIS — E1165 Type 2 diabetes mellitus with hyperglycemia: Secondary | ICD-10-CM | POA: Diagnosis not present

## 2016-06-26 DIAGNOSIS — M79671 Pain in right foot: Secondary | ICD-10-CM

## 2016-06-26 DIAGNOSIS — Z09 Encounter for follow-up examination after completed treatment for conditions other than malignant neoplasm: Secondary | ICD-10-CM

## 2016-06-26 NOTE — Progress Notes (Signed)
Subjective: Candace Edwards is a 36 y.o. female patient seen today in office for POV #2 (DOS 06-12-16, S/P excision of benign lesion right foot. Patient denies pain at surgical site, denies calf pain, denies headache, chest pain, shortness of breath, nausea, vomiting, fever, or chills. Patient states that she is doing good. No other issues noted.   There are no active problems to display for this patient.   Current Outpatient Prescriptions on File Prior to Visit  Medication Sig Dispense Refill  . azithromycin (ZITHROMAX) 250 MG tablet     . docusate sodium (COLACE) 100 MG capsule Take 100 mg by mouth 2 (two) times daily.    . meloxicam (MOBIC) 15 MG tablet Take 15 mg by mouth daily.    . metFORMIN (GLUCOPHAGE) 1000 MG tablet Take 1,000 mg by mouth 2 (two) times daily with a meal.    . Multiple Vitamin (MULTIVITAMIN) tablet Take 1 tablet by mouth daily. womens daily/lc    . phentermine (ADIPEX-P) 37.5 MG tablet Take 37.5 mg by mouth daily before breakfast.    . Vitamin D, Ergocalciferol, (DRISDOL) 50000 UNITS CAPS capsule Take 50,000 Units by mouth every 7 (seven) days.     No current facility-administered medications on file prior to visit.     Allergies  Allergen Reactions  . Amoxicillin   . Erythromycin   . Levaquin [Levofloxacin In D5w]   . Maxiphen [Phenylephrine-Guaifenesin]   . Penicillins     Objective: There were no vitals filed for this visit.  General: No acute distress, AAOx3  Right foot: Sutures intact with central granular tissue from planned tight closure with no new gapping or dehiscence at surgical site, + eschar/scabbing, mild swelling to right plantar foot, no erythema, no warmth, no drainage, no signs of infection noted, Capillary fill time <3 seconds in all digits, gross sensation present via light touch to right foot. No pain or crepitation with range of motion right foot.  No pain with calf compression.   Path- [place]   Assessment and Plan:  Problem List  Items Addressed This Visit    None    Visit Diagnoses    S/P excision of skin lesion, follow-up exam    -  Primary   Right foot pain           -Patient seen and evaluated -Path reviewed  -Sutures removed. Applied steristrips and dry sterile dressing to surgical site right foot secured with ACE wrap and stockinet  -Advised patient to make sure to keep dressings clean, dry, and intact to right surgical site, removing the ACE as needed until next week then can remove dressings and shower allowing steristrips to pull off on their own -Advised patient to continue with post-op shoe on right foot until next week then slowly wean to normal shoe -Advised patient to limit activity to necessity  -Advised patient to ice and elevate as necessary  -Work note: Return with no restrictions on 07-07-16 -Will plan for check of surgical site healing at next office visit. In the meantime, patient to call office if any issues or problems arise.   Asencion Islam, DPM

## 2016-06-27 ENCOUNTER — Encounter: Payer: BLUE CROSS/BLUE SHIELD | Admitting: Sports Medicine

## 2016-07-11 ENCOUNTER — Encounter: Payer: Self-pay | Admitting: Sports Medicine

## 2016-07-11 ENCOUNTER — Ambulatory Visit (INDEPENDENT_AMBULATORY_CARE_PROVIDER_SITE_OTHER): Payer: Self-pay | Admitting: Sports Medicine

## 2016-07-11 DIAGNOSIS — Z9889 Other specified postprocedural states: Secondary | ICD-10-CM

## 2016-07-11 DIAGNOSIS — B070 Plantar wart: Secondary | ICD-10-CM

## 2016-07-11 DIAGNOSIS — E119 Type 2 diabetes mellitus without complications: Secondary | ICD-10-CM

## 2016-07-11 NOTE — Progress Notes (Addendum)
Subjective: Elgie CongoShirley L Dearing is a 36 y.o. female patient seen today in office for POV #3 (DOS 06-12-16, S/P excision of benign lesion right foot. Patient denies pain at surgical site, denies calf pain, denies headache, chest pain, shortness of breath, nausea, vomiting, fever, or chills. Patient states that she is doing good and is back at work. No other issues noted.   FBS per patient is good  There are no active problems to display for this patient.   Current Outpatient Prescriptions on File Prior to Visit  Medication Sig Dispense Refill  . azithromycin (ZITHROMAX) 250 MG tablet     . docusate sodium (COLACE) 100 MG capsule Take 100 mg by mouth 2 (two) times daily.    . meloxicam (MOBIC) 15 MG tablet Take 15 mg by mouth daily.    . metFORMIN (GLUCOPHAGE) 1000 MG tablet Take 1,000 mg by mouth 2 (two) times daily with a meal.    . Multiple Vitamin (MULTIVITAMIN) tablet Take 1 tablet by mouth daily. womens daily/lc    . phentermine (ADIPEX-P) 37.5 MG tablet Take 37.5 mg by mouth daily before breakfast.    . Vitamin D, Ergocalciferol, (DRISDOL) 50000 UNITS CAPS capsule Take 50,000 Units by mouth every 7 (seven) days.     No current facility-administered medications on file prior to visit.     Allergies  Allergen Reactions  . Amoxicillin   . Erythromycin   . Levaquin [Levofloxacin In D5w]   . Maxiphen [Phenylephrine-Guaifenesin]   . Penicillins     Objective: There were no vitals filed for this visit.  General: No acute distress, AAOx3  Right foot: + central granular tissue from planned tight closure with no new gapping or dehiscence at surgical site, + eschar/scabbing, mild swelling to right plantar foot, no erythema, no warmth, no drainage, no signs of infection noted, Capillary fill time <3 seconds in all digits, gross sensation present via light touch to right foot. No pain or crepitation with range of motion right foot.  No pain with calf compression.   Assessment and Plan:   Problem List Items Addressed This Visit    None    Visit Diagnoses    S/P foot surgery, right    -  Primary   Plantar wart of right foot       Diabetes mellitus without complication (HCC)           -Patient seen and evaluated -Applied antibiotic cream and guaze and coban dressing to site and advised patient to do the same daily until completely healed  -Continue with normal shoe -Advised patient to limit activity to tolerance  -Advised patient to ice and elevate as necessary  -Continue with work, disability paperwork completed for patient with return to work date listed at 07-07-17 with no restrictions  -Will plan for re-check of surgical site healing at next office visit. In the meantime, patient to call office if any issues or problems arise.   Asencion Islamitorya Shanelle Clontz, DPM

## 2016-07-17 NOTE — Progress Notes (Signed)
DOS 06/12/2016 Excise wart plantar right foot

## 2016-07-25 ENCOUNTER — Ambulatory Visit (INDEPENDENT_AMBULATORY_CARE_PROVIDER_SITE_OTHER): Payer: Self-pay | Admitting: Sports Medicine

## 2016-07-25 ENCOUNTER — Encounter: Payer: Self-pay | Admitting: Sports Medicine

## 2016-07-25 DIAGNOSIS — Z9889 Other specified postprocedural states: Secondary | ICD-10-CM

## 2016-07-25 DIAGNOSIS — B070 Plantar wart: Secondary | ICD-10-CM

## 2016-07-25 MED ORDER — LIDOCAINE 5 % EX OINT: 1.0000 "application " | TOPICAL_OINTMENT | CUTANEOUS | 0 refills | Status: DC | PRN

## 2016-07-25 NOTE — Progress Notes (Signed)
Subjective: Candace Edwards is a 36 y.o. female patient seen today in office for POV #4 (DOS 06-12-16, S/P excision of benign lesion right foot. Patient denies pain at surgical site however on yesterday had to respond to a code at work and started to have some soreness over the incision area, denies calf pain, denies headache, chest pain, shortness of breath, nausea, vomiting, fever, or chills. No other issues noted.   FBS per patient is good  There are no active problems to display for this patient.   Current Outpatient Prescriptions on File Prior to Visit  Medication Sig Dispense Refill  . azithromycin (ZITHROMAX) 250 MG tablet     . docusate sodium (COLACE) 100 MG capsule Take 100 mg by mouth 2 (two) times daily.    Marland Kitchen. docusate sodium (COLACE) 100 MG capsule Take 100 mg by mouth 2 (two) times daily as needed for mild constipation.    Marland Kitchen. HYDROcodone-acetaminophen (NORCO) 10-325 MG tablet Take 1 tablet by mouth every 6 (six) hours as needed.    . meloxicam (MOBIC) 15 MG tablet Take 15 mg by mouth daily.    . metFORMIN (GLUCOPHAGE) 1000 MG tablet Take 1,000 mg by mouth 2 (two) times daily with a meal.    . Multiple Vitamin (MULTIVITAMIN) tablet Take 1 tablet by mouth daily. womens daily/lc    . phentermine (ADIPEX-P) 37.5 MG tablet Take 37.5 mg by mouth daily before breakfast.    . promethazine (PHENERGAN) 25 MG tablet Take 1 mg by mouth every 8 (eight) hours as needed for nausea or vomiting.    . Vitamin D, Ergocalciferol, (DRISDOL) 50000 UNITS CAPS capsule Take 50,000 Units by mouth every 7 (seven) days.     No current facility-administered medications on file prior to visit.     Allergies  Allergen Reactions  . Amoxicillin   . Erythromycin   . Levaquin [Levofloxacin In D5w]   . Maxiphen [Phenylephrine-Guaifenesin]   . Penicillins     Objective: There were no vitals filed for this visit.  General: No acute distress, AAOx3  Right foot: Well healed surgical site, +  eschar/scabbing, mild swelling to right plantar foot, no erythema, no warmth, no drainage, no signs of infection noted, Capillary fill time <3 seconds in all digits, gross sensation present via light touch to right foot. Mild pain to central incision site. No pain or crepitation with range of motion right foot.  No pain with calf compression.   Assessment and Plan:  Problem List Items Addressed This Visit    None    Visit Diagnoses    S/P foot surgery, right    -  Primary   Relevant Medications   lidocaine (XYLOCAINE) 5 % ointment   Plantar wart of right foot       Relevant Medications   lidocaine (XYLOCAINE) 5 % ointment      -Patient seen and evaluated -Applied Mepilex Ag to site and advised patient to use topical lidocaine under occulusion as Rx for any pain -Patient may shower and get foot wet -Continue with normal shoe -Advised patient to limit activity to tolerance  -Advised patient to ice and elevate as necessary  -Will plan for final re-check of surgical site healing at next office visit. In the meantime, patient to call office if any issues or problems arise.   Asencion Islamitorya Rochele Lueck, DPM

## 2016-08-22 ENCOUNTER — Ambulatory Visit (INDEPENDENT_AMBULATORY_CARE_PROVIDER_SITE_OTHER): Payer: BLUE CROSS/BLUE SHIELD | Admitting: Sports Medicine

## 2016-08-22 DIAGNOSIS — Z9889 Other specified postprocedural states: Secondary | ICD-10-CM

## 2016-08-22 DIAGNOSIS — E119 Type 2 diabetes mellitus without complications: Secondary | ICD-10-CM

## 2016-08-22 DIAGNOSIS — L570 Actinic keratosis: Secondary | ICD-10-CM

## 2016-08-22 MED ORDER — SALICYLIC ACID 17 % EX GEL: Freq: Every day | CUTANEOUS | 1 refills | Status: DC

## 2016-08-22 NOTE — Progress Notes (Signed)
Subjective: Candace Edwards is a 36 y.o. female patient seen today in office for POV #5 (DOS 06-12-16, S/P excision of benign lesion right foot. Patient denies pain at surgical site reports that she had a large piece of scab or skin come off, denies calf pain, denies headache, chest pain, shortness of breath, nausea, vomiting, fever, or chills. No other issues noted.   FBS per patient is good  There are no active problems to display for this patient.   Current Outpatient Prescriptions on File Prior to Visit  Medication Sig Dispense Refill  . azithromycin (ZITHROMAX) 250 MG tablet     . docusate sodium (COLACE) 100 MG capsule Take 100 mg by mouth 2 (two) times daily.    Marland Kitchen. docusate sodium (COLACE) 100 MG capsule Take 100 mg by mouth 2 (two) times daily as needed for mild constipation.    Marland Kitchen. HYDROcodone-acetaminophen (NORCO) 10-325 MG tablet Take 1 tablet by mouth every 6 (six) hours as needed.    . lidocaine (XYLOCAINE) 5 % ointment Apply 1 application topically as needed. For foot pain 35.44 g 0  . meloxicam (MOBIC) 15 MG tablet Take 15 mg by mouth daily.    . metFORMIN (GLUCOPHAGE) 1000 MG tablet Take 1,000 mg by mouth 2 (two) times daily with a meal.    . Multiple Vitamin (MULTIVITAMIN) tablet Take 1 tablet by mouth daily. womens daily/lc    . phentermine (ADIPEX-P) 37.5 MG tablet Take 37.5 mg by mouth daily before breakfast.    . promethazine (PHENERGAN) 25 MG tablet Take 1 mg by mouth every 8 (eight) hours as needed for nausea or vomiting.    . Vitamin D, Ergocalciferol, (DRISDOL) 50000 UNITS CAPS capsule Take 50,000 Units by mouth every 7 (seven) days.     No current facility-administered medications on file prior to visit.     Allergies  Allergen Reactions  . Amoxicillin   . Erythromycin   . Levaquin [Levofloxacin In D5w]   . Maxiphen [Phenylephrine-Guaifenesin]   . Penicillins     Objective: There were no vitals filed for this visit.  General: No acute distress, AAOx3   Right foot: Well healed surgical site, + Reactive keratosis with mild scabbing, mild swelling to right plantar foot, no erythema, no warmth, no drainage, no signs of infection noted, Capillary fill time <3 seconds in all digits, gross sensation present via light touch to right foot. Mild with deep palpation to central incision site. However, no pain with walking or standing. No pain or crepitation with range of motion right foot.  No pain with calf compression.   Assessment and Plan:  Problem List Items Addressed This Visit    None    Visit Diagnoses    Keratosis    -  Primary   Relevant Medications   salicylic acid 17 % gel   S/P foot surgery, right       Diabetes mellitus without complication (HCC)          -Patient seen and evaluated -Prescribed salicylic acid to use at bedtime to assist with keratotic areas to naturally shed off -Continue with normal shoe -Advised patient to resume normal activity -Advised patient to ice and elevate as necessary for any pain -Will plan for re-check of surgical site healing at next office visit. In the meantime, patient to call office if any issues or problems arise.   Asencion Islamitorya Joletta Manner, DPM

## 2016-09-09 DIAGNOSIS — Z111 Encounter for screening for respiratory tuberculosis: Secondary | ICD-10-CM | POA: Diagnosis not present

## 2016-09-09 DIAGNOSIS — Z6841 Body Mass Index (BMI) 40.0 and over, adult: Secondary | ICD-10-CM | POA: Diagnosis not present

## 2016-09-25 DIAGNOSIS — E1165 Type 2 diabetes mellitus with hyperglycemia: Secondary | ICD-10-CM | POA: Diagnosis not present

## 2016-09-25 DIAGNOSIS — E782 Mixed hyperlipidemia: Secondary | ICD-10-CM | POA: Diagnosis not present

## 2016-10-02 DIAGNOSIS — E1165 Type 2 diabetes mellitus with hyperglycemia: Secondary | ICD-10-CM | POA: Diagnosis not present

## 2016-10-02 DIAGNOSIS — E782 Mixed hyperlipidemia: Secondary | ICD-10-CM | POA: Diagnosis not present

## 2016-10-02 DIAGNOSIS — F331 Major depressive disorder, recurrent, moderate: Secondary | ICD-10-CM | POA: Diagnosis not present

## 2016-10-02 DIAGNOSIS — Z111 Encounter for screening for respiratory tuberculosis: Secondary | ICD-10-CM | POA: Diagnosis not present

## 2016-10-02 DIAGNOSIS — K219 Gastro-esophageal reflux disease without esophagitis: Secondary | ICD-10-CM | POA: Diagnosis not present

## 2016-10-03 DIAGNOSIS — R7611 Nonspecific reaction to tuberculin skin test without active tuberculosis: Secondary | ICD-10-CM | POA: Diagnosis not present

## 2016-10-24 ENCOUNTER — Ambulatory Visit: Payer: BLUE CROSS/BLUE SHIELD | Admitting: Sports Medicine

## 2016-10-30 ENCOUNTER — Ambulatory Visit (INDEPENDENT_AMBULATORY_CARE_PROVIDER_SITE_OTHER): Payer: BLUE CROSS/BLUE SHIELD | Admitting: Sports Medicine

## 2016-10-30 ENCOUNTER — Encounter (INDEPENDENT_AMBULATORY_CARE_PROVIDER_SITE_OTHER): Payer: Self-pay

## 2016-10-30 DIAGNOSIS — L570 Actinic keratosis: Secondary | ICD-10-CM | POA: Diagnosis not present

## 2016-10-30 DIAGNOSIS — Z9889 Other specified postprocedural states: Secondary | ICD-10-CM

## 2016-10-30 DIAGNOSIS — E119 Type 2 diabetes mellitus without complications: Secondary | ICD-10-CM | POA: Diagnosis not present

## 2016-10-30 NOTE — Progress Notes (Signed)
Subjective: Candace Edwards is a 36 y.o. female patient seen today in office for POV #6 (DOS 06-12-16), S/P excision of benign lesion right foot. Patient denies pain at surgical site reports that the area is doing good. No other issues noted.   FBS per patient is good  There are no active problems to display for this patient.   Current Outpatient Prescriptions on File Prior to Visit  Medication Sig Dispense Refill  . azithromycin (ZITHROMAX) 250 MG tablet     . docusate sodium (COLACE) 100 MG capsule Take 100 mg by mouth 2 (two) times daily.    Marland Kitchen. docusate sodium (COLACE) 100 MG capsule Take 100 mg by mouth 2 (two) times daily as needed for mild constipation.    Marland Kitchen. HYDROcodone-acetaminophen (NORCO) 10-325 MG tablet Take 1 tablet by mouth every 6 (six) hours as needed.    . lidocaine (XYLOCAINE) 5 % ointment Apply 1 application topically as needed. For foot pain 35.44 g 0  . meloxicam (MOBIC) 15 MG tablet Take 15 mg by mouth daily.    . metFORMIN (GLUCOPHAGE) 1000 MG tablet Take 1,000 mg by mouth 2 (two) times daily with a meal.    . Multiple Vitamin (MULTIVITAMIN) tablet Take 1 tablet by mouth daily. womens daily/lc    . phentermine (ADIPEX-P) 37.5 MG tablet Take 37.5 mg by mouth daily before breakfast.    . promethazine (PHENERGAN) 25 MG tablet Take 1 mg by mouth every 8 (eight) hours as needed for nausea or vomiting.    . salicylic acid 17 % gel Apply topically at bedtime. 14 g 1  . Vitamin D, Ergocalciferol, (DRISDOL) 50000 UNITS CAPS capsule Take 50,000 Units by mouth every 7 (seven) days.     No current facility-administered medications on file prior to visit.     Allergies  Allergen Reactions  . Amoxicillin   . Erythromycin   . Levaquin [Levofloxacin In D5w]   . Maxiphen [Phenylephrine-Guaifenesin]   . Penicillins     Objective: There were no vitals filed for this visit.  General: No acute distress, AAOx3  Right foot: Well healed surgical site, + very minimal reactive  keratosis, no swelling to right plantar foot, no erythema, no warmth, no drainage, no signs of infection noted, Capillary fill time <3 seconds in all digits, gross sensation present via light touch to right foot. No pain with palpation to incision site. However, subjective numbness. No pain or crepitation with range of motion right foot.  No pain with calf compression.   Assessment and Plan:  Problem List Items Addressed This Visit    None    Visit Diagnoses    Keratosis    -  Primary   S/P foot surgery, right       Diabetes mellitus without complication (HCC)          -Patient seen and evaluated -Advised daily inspection of feet in setting of diabetes  -Advised patient that numbness is typical after surgery as long as not painful -Advised patient on daily skin emollients to help with dry skin and keratosis -Continue with normal shoe -Continue with normal activity -Patient discharged from post op care and is well healed. In the meantime, patient to call office if any issues or problems arise.   Asencion Islamitorya Joselinne Lawal, DPM

## 2016-11-04 DIAGNOSIS — N939 Abnormal uterine and vaginal bleeding, unspecified: Secondary | ICD-10-CM | POA: Diagnosis not present

## 2016-11-12 DIAGNOSIS — N939 Abnormal uterine and vaginal bleeding, unspecified: Secondary | ICD-10-CM | POA: Diagnosis not present

## 2016-11-19 DIAGNOSIS — F17210 Nicotine dependence, cigarettes, uncomplicated: Secondary | ICD-10-CM | POA: Diagnosis not present

## 2016-11-19 DIAGNOSIS — E119 Type 2 diabetes mellitus without complications: Secondary | ICD-10-CM | POA: Diagnosis not present

## 2016-11-19 DIAGNOSIS — F419 Anxiety disorder, unspecified: Secondary | ICD-10-CM | POA: Diagnosis not present

## 2016-11-19 DIAGNOSIS — N938 Other specified abnormal uterine and vaginal bleeding: Secondary | ICD-10-CM | POA: Diagnosis not present

## 2016-11-19 DIAGNOSIS — Z853 Personal history of malignant neoplasm of breast: Secondary | ICD-10-CM | POA: Diagnosis not present

## 2016-11-19 DIAGNOSIS — K219 Gastro-esophageal reflux disease without esophagitis: Secondary | ICD-10-CM | POA: Diagnosis not present

## 2016-11-19 DIAGNOSIS — Z79899 Other long term (current) drug therapy: Secondary | ICD-10-CM | POA: Diagnosis not present

## 2016-11-19 DIAGNOSIS — N939 Abnormal uterine and vaginal bleeding, unspecified: Secondary | ICD-10-CM | POA: Diagnosis not present

## 2016-11-26 DIAGNOSIS — N939 Abnormal uterine and vaginal bleeding, unspecified: Secondary | ICD-10-CM | POA: Diagnosis not present

## 2016-11-27 DIAGNOSIS — Z6841 Body Mass Index (BMI) 40.0 and over, adult: Secondary | ICD-10-CM | POA: Diagnosis not present

## 2016-11-27 DIAGNOSIS — Z7189 Other specified counseling: Secondary | ICD-10-CM | POA: Diagnosis not present

## 2016-11-27 DIAGNOSIS — J40 Bronchitis, not specified as acute or chronic: Secondary | ICD-10-CM | POA: Diagnosis not present

## 2016-12-09 DIAGNOSIS — Z6841 Body Mass Index (BMI) 40.0 and over, adult: Secondary | ICD-10-CM | POA: Diagnosis not present

## 2016-12-09 DIAGNOSIS — Z23 Encounter for immunization: Secondary | ICD-10-CM | POA: Diagnosis not present

## 2016-12-09 DIAGNOSIS — Z01818 Encounter for other preprocedural examination: Secondary | ICD-10-CM | POA: Diagnosis not present

## 2016-12-09 DIAGNOSIS — E119 Type 2 diabetes mellitus without complications: Secondary | ICD-10-CM | POA: Diagnosis not present

## 2016-12-11 DIAGNOSIS — N939 Abnormal uterine and vaginal bleeding, unspecified: Secondary | ICD-10-CM | POA: Diagnosis not present

## 2016-12-18 DIAGNOSIS — E669 Obesity, unspecified: Secondary | ICD-10-CM | POA: Diagnosis not present

## 2016-12-18 DIAGNOSIS — K219 Gastro-esophageal reflux disease without esophagitis: Secondary | ICD-10-CM | POA: Diagnosis not present

## 2016-12-18 DIAGNOSIS — Z79899 Other long term (current) drug therapy: Secondary | ICD-10-CM | POA: Diagnosis not present

## 2016-12-18 DIAGNOSIS — N939 Abnormal uterine and vaginal bleeding, unspecified: Secondary | ICD-10-CM | POA: Diagnosis not present

## 2016-12-18 DIAGNOSIS — Z7984 Long term (current) use of oral hypoglycemic drugs: Secondary | ICD-10-CM | POA: Diagnosis not present

## 2016-12-18 DIAGNOSIS — E119 Type 2 diabetes mellitus without complications: Secondary | ICD-10-CM | POA: Diagnosis not present

## 2017-01-27 DIAGNOSIS — D72829 Elevated white blood cell count, unspecified: Secondary | ICD-10-CM | POA: Diagnosis not present

## 2017-01-27 DIAGNOSIS — E1165 Type 2 diabetes mellitus with hyperglycemia: Secondary | ICD-10-CM | POA: Diagnosis not present

## 2017-01-27 DIAGNOSIS — E782 Mixed hyperlipidemia: Secondary | ICD-10-CM | POA: Diagnosis not present

## 2017-02-03 DIAGNOSIS — E1165 Type 2 diabetes mellitus with hyperglycemia: Secondary | ICD-10-CM | POA: Diagnosis not present

## 2017-02-03 DIAGNOSIS — K219 Gastro-esophageal reflux disease without esophagitis: Secondary | ICD-10-CM | POA: Diagnosis not present

## 2017-02-03 DIAGNOSIS — E1169 Type 2 diabetes mellitus with other specified complication: Secondary | ICD-10-CM | POA: Diagnosis not present

## 2017-02-03 DIAGNOSIS — E785 Hyperlipidemia, unspecified: Secondary | ICD-10-CM | POA: Diagnosis not present

## 2017-03-02 DIAGNOSIS — C50911 Malignant neoplasm of unspecified site of right female breast: Secondary | ICD-10-CM | POA: Diagnosis not present

## 2017-04-08 DIAGNOSIS — H6693 Otitis media, unspecified, bilateral: Secondary | ICD-10-CM | POA: Diagnosis not present

## 2017-04-08 DIAGNOSIS — Z79899 Other long term (current) drug therapy: Secondary | ICD-10-CM | POA: Diagnosis not present

## 2017-04-08 DIAGNOSIS — J111 Influenza due to unidentified influenza virus with other respiratory manifestations: Secondary | ICD-10-CM | POA: Diagnosis not present

## 2017-04-08 DIAGNOSIS — Z9851 Tubal ligation status: Secondary | ICD-10-CM | POA: Diagnosis not present

## 2017-04-08 DIAGNOSIS — H9203 Otalgia, bilateral: Secondary | ICD-10-CM | POA: Diagnosis not present

## 2017-04-08 DIAGNOSIS — K219 Gastro-esophageal reflux disease without esophagitis: Secondary | ICD-10-CM | POA: Diagnosis not present

## 2017-04-08 DIAGNOSIS — R0781 Pleurodynia: Secondary | ICD-10-CM | POA: Diagnosis not present

## 2017-04-08 DIAGNOSIS — E119 Type 2 diabetes mellitus without complications: Secondary | ICD-10-CM | POA: Diagnosis not present

## 2017-04-08 DIAGNOSIS — R5383 Other fatigue: Secondary | ICD-10-CM | POA: Diagnosis not present

## 2017-04-08 DIAGNOSIS — R05 Cough: Secondary | ICD-10-CM | POA: Diagnosis not present

## 2017-04-08 DIAGNOSIS — Z853 Personal history of malignant neoplasm of breast: Secondary | ICD-10-CM | POA: Diagnosis not present

## 2017-04-08 DIAGNOSIS — J1183 Influenza due to unidentified influenza virus with otitis media: Secondary | ICD-10-CM | POA: Diagnosis not present

## 2017-04-08 DIAGNOSIS — F17210 Nicotine dependence, cigarettes, uncomplicated: Secondary | ICD-10-CM | POA: Diagnosis not present

## 2017-04-08 DIAGNOSIS — F419 Anxiety disorder, unspecified: Secondary | ICD-10-CM | POA: Diagnosis not present

## 2017-04-08 DIAGNOSIS — Z9049 Acquired absence of other specified parts of digestive tract: Secondary | ICD-10-CM | POA: Diagnosis not present

## 2017-04-08 DIAGNOSIS — Z9010 Acquired absence of unspecified breast and nipple: Secondary | ICD-10-CM | POA: Diagnosis not present

## 2017-04-16 DIAGNOSIS — R6889 Other general symptoms and signs: Secondary | ICD-10-CM | POA: Diagnosis not present

## 2017-06-01 DIAGNOSIS — E1165 Type 2 diabetes mellitus with hyperglycemia: Secondary | ICD-10-CM | POA: Diagnosis not present

## 2017-06-01 DIAGNOSIS — E1169 Type 2 diabetes mellitus with other specified complication: Secondary | ICD-10-CM | POA: Diagnosis not present

## 2017-06-09 DIAGNOSIS — E1159 Type 2 diabetes mellitus with other circulatory complications: Secondary | ICD-10-CM | POA: Diagnosis not present

## 2017-06-09 DIAGNOSIS — E785 Hyperlipidemia, unspecified: Secondary | ICD-10-CM | POA: Diagnosis not present

## 2017-06-09 DIAGNOSIS — E1169 Type 2 diabetes mellitus with other specified complication: Secondary | ICD-10-CM | POA: Diagnosis not present

## 2017-06-09 DIAGNOSIS — I10 Essential (primary) hypertension: Secondary | ICD-10-CM | POA: Diagnosis not present

## 2017-06-10 ENCOUNTER — Encounter: Payer: Self-pay | Admitting: Sports Medicine

## 2017-06-10 ENCOUNTER — Ambulatory Visit: Payer: BLUE CROSS/BLUE SHIELD | Admitting: Sports Medicine

## 2017-06-10 DIAGNOSIS — L570 Actinic keratosis: Secondary | ICD-10-CM

## 2017-06-10 DIAGNOSIS — E119 Type 2 diabetes mellitus without complications: Secondary | ICD-10-CM | POA: Diagnosis not present

## 2017-06-10 NOTE — Progress Notes (Signed)
Subjective: Candace Edwards is a 37 y.o. female patient seen today in office for a painful hard lesion at the end of the incision area where she had previous surgery on the bottom of her right foot.  Patient is status post  excision of benign lesion right foot (DOS 06-12-16),. Patient reports that over the last week above the incision area has been tender.  Patient has not tried any treatment.  No other issues noted.   FBS per patient is 64.  Last A1c 6.6 saw Dr. Mayford Knife her primary care doctor 1 day ago.  There are no active problems to display for this patient.   Current Outpatient Medications on File Prior to Visit  Medication Sig Dispense Refill  . azithromycin (ZITHROMAX) 250 MG tablet     . docusate sodium (COLACE) 100 MG capsule Take 100 mg by mouth 2 (two) times daily.    Marland Kitchen docusate sodium (COLACE) 100 MG capsule Take 100 mg by mouth 2 (two) times daily as needed for mild constipation.    Marland Kitchen HYDROcodone-acetaminophen (NORCO) 10-325 MG tablet Take 1 tablet by mouth every 6 (six) hours as needed.    . lidocaine (XYLOCAINE) 5 % ointment Apply 1 application topically as needed. For foot pain 35.44 g 0  . meloxicam (MOBIC) 15 MG tablet Take 15 mg by mouth daily.    . metFORMIN (GLUCOPHAGE) 1000 MG tablet Take 1,000 mg by mouth 2 (two) times daily with a meal.    . Multiple Vitamin (MULTIVITAMIN) tablet Take 1 tablet by mouth daily. womens daily/lc    . phentermine (ADIPEX-P) 37.5 MG tablet Take 37.5 mg by mouth daily before breakfast.    . promethazine (PHENERGAN) 25 MG tablet Take 1 mg by mouth every 8 (eight) hours as needed for nausea or vomiting.    . salicylic acid 17 % gel Apply topically at bedtime. 14 g 1  . Vitamin D, Ergocalciferol, (DRISDOL) 50000 UNITS CAPS capsule Take 50,000 Units by mouth every 7 (seven) days.     No current facility-administered medications on file prior to visit.     Allergies  Allergen Reactions  . Amoxicillin   . Erythromycin   . Levaquin  [Levofloxacin In D5w]   . Maxiphen [Phenylephrine-Guaifenesin]   . Penicillins     Objective: There were no vitals filed for this visit.  General: No acute distress, AAOx3  Right foot: Well healed surgical site, to the distal aspect of the incision line there is a small keratotic lesion with a nucleated core likely consistent with porokeratosis with tenderness to touch, no swelling to right plantar foot, no erythema, no warmth, no drainage, no signs of infection noted, Capillary fill time <3 seconds in all digits, gross sensation present via light touch to right foot. No pain with palpation to incision site. However there is pain at the lesion site distally and subjective numbness. No pain or crepitation with range of motion right foot.  No pain with calf compression.   Assessment and Plan:  Problem List Items Addressed This Visit    None    Visit Diagnoses    Keratosis    -  Primary   Diabetes mellitus without complication (HCC)          -Patient seen and evaluated -Advised daily inspection of feet in setting of diabetes  -Mechanically debrided keratotic lesion using a chisel blade and applied a small amount of catheridin cover with offloading pad and Band-Aid advised patient to watch for possible blistering reaction if  recurs apply antibiotic cream -Advised patient that numbness is typical after surgery as long as not painful -Continue with normal shoe -Continue with normal activity -Patient to return to office for recheck of keratotic lesion if seems like it has not resolved within the next month. In the meantime, patient to call office if any issues or problems arise.   Asencion Islamitorya Boleslaus Holloway, DPM

## 2017-09-09 DIAGNOSIS — E782 Mixed hyperlipidemia: Secondary | ICD-10-CM | POA: Diagnosis not present

## 2017-09-09 DIAGNOSIS — E1165 Type 2 diabetes mellitus with hyperglycemia: Secondary | ICD-10-CM | POA: Diagnosis not present

## 2017-09-17 DIAGNOSIS — E785 Hyperlipidemia, unspecified: Secondary | ICD-10-CM | POA: Diagnosis not present

## 2017-09-17 DIAGNOSIS — Z6841 Body Mass Index (BMI) 40.0 and over, adult: Secondary | ICD-10-CM | POA: Diagnosis not present

## 2017-09-17 DIAGNOSIS — Z1331 Encounter for screening for depression: Secondary | ICD-10-CM | POA: Diagnosis not present

## 2017-09-17 DIAGNOSIS — E1169 Type 2 diabetes mellitus with other specified complication: Secondary | ICD-10-CM | POA: Diagnosis not present

## 2017-09-17 DIAGNOSIS — K219 Gastro-esophageal reflux disease without esophagitis: Secondary | ICD-10-CM | POA: Diagnosis not present

## 2017-09-17 DIAGNOSIS — Z7189 Other specified counseling: Secondary | ICD-10-CM | POA: Diagnosis not present

## 2018-01-12 DIAGNOSIS — E782 Mixed hyperlipidemia: Secondary | ICD-10-CM | POA: Diagnosis not present

## 2018-01-12 DIAGNOSIS — E1165 Type 2 diabetes mellitus with hyperglycemia: Secondary | ICD-10-CM | POA: Diagnosis not present

## 2018-01-18 DIAGNOSIS — E1169 Type 2 diabetes mellitus with other specified complication: Secondary | ICD-10-CM | POA: Diagnosis not present

## 2018-01-18 DIAGNOSIS — E785 Hyperlipidemia, unspecified: Secondary | ICD-10-CM | POA: Diagnosis not present

## 2018-01-18 DIAGNOSIS — F331 Major depressive disorder, recurrent, moderate: Secondary | ICD-10-CM | POA: Diagnosis not present

## 2018-02-10 DIAGNOSIS — Z6841 Body Mass Index (BMI) 40.0 and over, adult: Secondary | ICD-10-CM | POA: Diagnosis not present

## 2018-02-10 DIAGNOSIS — J22 Unspecified acute lower respiratory infection: Secondary | ICD-10-CM | POA: Diagnosis not present

## 2018-04-13 DIAGNOSIS — Z6841 Body Mass Index (BMI) 40.0 and over, adult: Secondary | ICD-10-CM | POA: Diagnosis not present

## 2018-04-13 DIAGNOSIS — R102 Pelvic and perineal pain: Secondary | ICD-10-CM | POA: Diagnosis not present

## 2018-04-15 DIAGNOSIS — N888 Other specified noninflammatory disorders of cervix uteri: Secondary | ICD-10-CM | POA: Diagnosis not present

## 2018-04-15 DIAGNOSIS — R102 Pelvic and perineal pain: Secondary | ICD-10-CM | POA: Diagnosis not present

## 2018-04-20 DIAGNOSIS — D72829 Elevated white blood cell count, unspecified: Secondary | ICD-10-CM | POA: Diagnosis not present

## 2018-04-20 DIAGNOSIS — Z6841 Body Mass Index (BMI) 40.0 and over, adult: Secondary | ICD-10-CM | POA: Diagnosis not present

## 2018-04-20 DIAGNOSIS — R102 Pelvic and perineal pain: Secondary | ICD-10-CM | POA: Diagnosis not present

## 2018-04-21 ENCOUNTER — Encounter: Payer: Self-pay | Admitting: Sports Medicine

## 2018-04-21 ENCOUNTER — Ambulatory Visit: Payer: BLUE CROSS/BLUE SHIELD | Admitting: Sports Medicine

## 2018-04-21 DIAGNOSIS — E119 Type 2 diabetes mellitus without complications: Secondary | ICD-10-CM | POA: Diagnosis not present

## 2018-04-21 DIAGNOSIS — L570 Actinic keratosis: Secondary | ICD-10-CM | POA: Diagnosis not present

## 2018-04-21 DIAGNOSIS — L84 Corns and callosities: Secondary | ICD-10-CM | POA: Diagnosis not present

## 2018-04-21 DIAGNOSIS — M79671 Pain in right foot: Secondary | ICD-10-CM

## 2018-04-21 DIAGNOSIS — M79672 Pain in left foot: Secondary | ICD-10-CM

## 2018-04-21 NOTE — Progress Notes (Signed)
Subjective: Candace Edwards is a 38 y.o. female patient seen today in office for a painful hard lesions at the end of the incision area where she had previous surgery on the bottom of her right foot and to the ball of the left foot.  Patient reports that she has been going for pedicures and they have been trying to help keep them filed down however over the last year slowly little by little the areas have become more tender and sore ranks pain 6 out of 10 worse with walking or pressure.  Patient is diabetic. FBS per patient is 84.  Last A1c 6.1 saw Dr. Nyra Capes her primary care doctor 1 day ago.  There are no active problems to display for this patient.   Current Outpatient Medications on File Prior to Visit  Medication Sig Dispense Refill  . azithromycin (ZITHROMAX) 250 MG tablet     . docusate sodium (COLACE) 100 MG capsule Take 100 mg by mouth 2 (two) times daily.    Marland Kitchen docusate sodium (COLACE) 100 MG capsule Take 100 mg by mouth 2 (two) times daily as needed for mild constipation.    Marland Kitchen HYDROcodone-acetaminophen (NORCO) 10-325 MG tablet Take 1 tablet by mouth every 6 (six) hours as needed.    . lidocaine (XYLOCAINE) 5 % ointment Apply 1 application topically as needed. For foot pain 35.44 g 0  . meloxicam (MOBIC) 15 MG tablet Take 15 mg by mouth daily.    . metFORMIN (GLUCOPHAGE) 1000 MG tablet Take 1,000 mg by mouth 2 (two) times daily with a meal.    . Multiple Vitamin (MULTIVITAMIN) tablet Take 1 tablet by mouth daily. womens daily/lc    . phentermine (ADIPEX-P) 37.5 MG tablet Take 37.5 mg by mouth daily before breakfast.    . promethazine (PHENERGAN) 25 MG tablet Take 1 mg by mouth every 8 (eight) hours as needed for nausea or vomiting.    . salicylic acid 17 % gel Apply topically at bedtime. 14 g 1  . Vitamin D, Ergocalciferol, (DRISDOL) 50000 UNITS CAPS capsule Take 50,000 Units by mouth every 7 (seven) days.     No current facility-administered medications on file prior to visit.      Allergies  Allergen Reactions  . Amoxicillin   . Erythromycin   . Levaquin [Levofloxacin In D5w]   . Maxiphen [Phenylephrine-Guaifenesin]   . Penicillins     Objective: There were no vitals filed for this visit.  General: No acute distress, AAOx3  Dermatology: There is thickened callus skin noted sub-met 2 and 5 on left and plantar lateral right foot.  At plantar lateral right foot there is old scar that is well-healed however diffusely on the bottoms of both feet there is dry skin and buildup of callus tissue.  No acute signs of infection.  Neurovascular: Protective sensation intact neurovascular structures are intact.  Capillary fill time to toes intact.  Musculoskeletal: Pes planus foot type.  Mild pain to palpation to callus areas bilateral.  Assessment and Plan:  Problem List Items Addressed This Visit    None    Visit Diagnoses    Keratosis    -  Primary   Bilateral foot pain       Callus of foot       Diabetes mellitus without complication ([place])          -Patient seen and evaluated -Advised daily inspection of feet in setting of diabetes and advised patient to get good supportive shoes to refrain from  wearing soft flimsy ballet flats -Mechanically debrided keratotic lesions using a chisel blade without incident and applied salidocaine covered with a Band-Aid -Recommend [place] healthy feet and good skin care hygiene -Patient to return to office as needed for callus care and yearly for diabetic foot exam. In the meantime, patient to call office if any issues or problems arise.   Landis Martins, DPM

## 2018-04-21 NOTE — Patient Instructions (Signed)
[employer] Healthy Feet cream  For tennis shoes recommend:  Anne Shutter Ascis New balance Saucony Can be purchased at [employer] sports or [employer]  Vionic  SAS Can be purchased at [employer] or [employer]   For work shoes recommend: [employer] Work [employer]  Can be purchased at a variety of places or Scientist, product/process development   For casual shoes recommend: Vionic  Can be purchased at [employer] or [employer]

## 2018-04-22 DIAGNOSIS — F17210 Nicotine dependence, cigarettes, uncomplicated: Secondary | ICD-10-CM | POA: Diagnosis not present

## 2018-04-22 DIAGNOSIS — R1011 Right upper quadrant pain: Secondary | ICD-10-CM | POA: Diagnosis not present

## 2018-04-22 DIAGNOSIS — R1031 Right lower quadrant pain: Secondary | ICD-10-CM | POA: Diagnosis not present

## 2018-05-12 DIAGNOSIS — E782 Mixed hyperlipidemia: Secondary | ICD-10-CM | POA: Diagnosis not present

## 2018-05-12 DIAGNOSIS — E1165 Type 2 diabetes mellitus with hyperglycemia: Secondary | ICD-10-CM | POA: Diagnosis not present

## 2018-05-19 DIAGNOSIS — E785 Hyperlipidemia, unspecified: Secondary | ICD-10-CM | POA: Diagnosis not present

## 2018-05-19 DIAGNOSIS — E1169 Type 2 diabetes mellitus with other specified complication: Secondary | ICD-10-CM | POA: Diagnosis not present

## 2018-05-19 DIAGNOSIS — F331 Major depressive disorder, recurrent, moderate: Secondary | ICD-10-CM | POA: Diagnosis not present

## 2018-09-14 DIAGNOSIS — E1165 Type 2 diabetes mellitus with hyperglycemia: Secondary | ICD-10-CM | POA: Diagnosis not present

## 2018-09-14 DIAGNOSIS — E782 Mixed hyperlipidemia: Secondary | ICD-10-CM | POA: Diagnosis not present

## 2018-09-21 DIAGNOSIS — G43909 Migraine, unspecified, not intractable, without status migrainosus: Secondary | ICD-10-CM | POA: Diagnosis not present

## 2018-09-21 DIAGNOSIS — F331 Major depressive disorder, recurrent, moderate: Secondary | ICD-10-CM | POA: Diagnosis not present

## 2018-09-21 DIAGNOSIS — E785 Hyperlipidemia, unspecified: Secondary | ICD-10-CM | POA: Diagnosis not present

## 2018-09-21 DIAGNOSIS — E1169 Type 2 diabetes mellitus with other specified complication: Secondary | ICD-10-CM | POA: Diagnosis not present

## 2018-10-26 DIAGNOSIS — R05 Cough: Secondary | ICD-10-CM | POA: Diagnosis not present

## 2018-10-26 DIAGNOSIS — R062 Wheezing: Secondary | ICD-10-CM | POA: Diagnosis not present

## 2018-10-26 DIAGNOSIS — F17200 Nicotine dependence, unspecified, uncomplicated: Secondary | ICD-10-CM | POA: Diagnosis not present

## 2018-10-26 DIAGNOSIS — Z6841 Body Mass Index (BMI) 40.0 and over, adult: Secondary | ICD-10-CM | POA: Diagnosis not present

## 2018-11-18 ENCOUNTER — Ambulatory Visit: Payer: BC Managed Care – PPO

## 2018-11-18 ENCOUNTER — Other Ambulatory Visit: Payer: Self-pay

## 2018-11-18 ENCOUNTER — Encounter

## 2018-11-18 ENCOUNTER — Encounter: Payer: Self-pay | Admitting: Sports Medicine

## 2018-11-18 ENCOUNTER — Ambulatory Visit: Payer: BLUE CROSS/BLUE SHIELD | Admitting: Sports Medicine

## 2018-11-18 DIAGNOSIS — L570 Actinic keratosis: Secondary | ICD-10-CM

## 2018-11-18 DIAGNOSIS — B070 Plantar wart: Secondary | ICD-10-CM | POA: Diagnosis not present

## 2018-11-18 DIAGNOSIS — E119 Type 2 diabetes mellitus without complications: Secondary | ICD-10-CM

## 2018-11-18 DIAGNOSIS — M79671 Pain in right foot: Secondary | ICD-10-CM

## 2018-11-18 MED ORDER — SALICYLIC ACID 17 % EX GEL: Freq: Every day | CUTANEOUS | 0 refills | Status: DC

## 2018-11-18 NOTE — Progress Notes (Signed)
Subjective: Candace Edwards is a 38 y.o. female patient seen today in office for a painful hard lesion on the bottom of her right foot.  Patient reports that the initial lesion went away but now there is another hard spot that has come back along her incision line.  Patient denies redness warmth drainage or any acute symptoms just reports that it is pretty painful feels like she is stepping on a rock.  Patient is diabetic.  Reports that her last A1c was around 6 and next visit to PCP will be in November.  There are no active problems to display for this patient.   Current Outpatient Medications on File Prior to Visit  Medication Sig Dispense Refill  . albuterol (VENTOLIN HFA) 108 (90 Base) MCG/ACT inhaler INHALE 2 PUFFS BY MOUTH EVERY 4 TO 6 HOURS AS NEEDED FOR WHEEZING    . azithromycin (ZITHROMAX) 250 MG tablet     . busPIRone (BUSPAR) 15 MG tablet     . cephALEXin (KEFLEX) 500 MG capsule TAKE 1 CAPSULE BY MOUTH 4 TIMES DAILY UNTIL GONE    . docusate sodium (COLACE) 100 MG capsule Take 100 mg by mouth 2 (two) times daily.    Marland Kitchen docusate sodium (COLACE) 100 MG capsule Take 100 mg by mouth 2 (two) times daily as needed for mild constipation.    . furosemide (LASIX) 20 MG tablet Take by mouth.    Marland Kitchen HYDROcodone-acetaminophen (NORCO) 10-325 MG tablet Take 1 tablet by mouth every 6 (six) hours as needed.    Marland Kitchen JANUMET 50-500 MG tablet Take 1 tablet by mouth daily.    Marland Kitchen lidocaine (XYLOCAINE) 5 % ointment Apply 1 application topically as needed. For foot pain 35.44 g 0  . meloxicam (MOBIC) 15 MG tablet Take 15 mg by mouth daily.    . metFORMIN (GLUCOPHAGE) 1000 MG tablet Take 1,000 mg by mouth 2 (two) times daily with a meal.    . Multiple Vitamin (MULTIVITAMIN) tablet Take 1 tablet by mouth daily. womens daily/lc    . omeprazole (PRILOSEC) 20 MG capsule     . omeprazole (PRILOSEC) 40 MG capsule Take 40 mg by mouth daily.    . phentermine (ADIPEX-P) 37.5 MG tablet Take 37.5 mg by mouth daily  before breakfast.    . predniSONE (DELTASONE) 20 MG tablet Take 40 mg by mouth daily.    . promethazine (PHENERGAN) 25 MG tablet Take 1 mg by mouth every 8 (eight) hours as needed for nausea or vomiting.    . simvastatin (ZOCOR) 20 MG tablet TAKE 1 TABLET BY MOUTH TWICE A WEEK    . Vitamin D, Ergocalciferol, (DRISDOL) 50000 UNITS CAPS capsule Take 50,000 Units by mouth every 7 (seven) days.     No current facility-administered medications on file prior to visit.     Allergies  Allergen Reactions  . Amoxicillin   . Erythromycin   . Guaifenesin Other (See Comments)    Eyes Swelling  . Levaquin [Levofloxacin In D5w]   . Maxiphen [Phenylephrine-Guaifenesin]   . Penicillins     Objective: There were no vitals filed for this visit.  General: No acute distress, AAOx3  Dermatology: There is thickened callus skin noted  plantar lateral right foot with a corn that is nucleated possible old reoccurrence of wart versus keratosis.  At plantar lateral right foot there is old scar that is well-healed.  No acute signs of infection.  Neurovascular: Protective sensation intact neurovascular structures are intact.  Capillary fill time to toes  intact.  Musculoskeletal: Pes planus foot type.  Mild pain to palpation to affected area on right foot.  Assessment and Plan:  Problem List Items Addressed This Visit    None    Visit Diagnoses    Right foot pain    -  Primary   Relevant Orders   DG Foot Complete Left   Plantar wart       Relevant Medications   cephALEXin (KEFLEX) 500 MG capsule   salicylic acid 17 % gel   Keratosis       Diabetes mellitus without complication (HCC)       Relevant Medications   simvastatin (ZOCOR) 20 MG tablet   JANUMET 50-500 MG tablet      -Patient seen and evaluated -Mechanically debrided keratotic lesions using a chisel blade without incident and applied Cantharone covered with a Band-Aid -Prescribed salicylic acid for patient to use after 2 weeks if there is  still some buildup of skin in the area -Recommend patient to consider bionics with good supportive shoes for foot type and good hygiene habits of spraying up shoes with Lysol spray to prevent any reinfection -Patient to return to office in 1 month for follow-up evaluation of wart versus deep-seated keratosis. In the meantime, patient to call office if any issues or problems arise.   Asencion Islamitorya Shamere Campas, DPM

## 2018-11-18 NOTE — Patient Instructions (Addendum)
For tennis shoes recommend:  Kandy Garrison Ascis New balance Saucony Can be purchased at [employer] sports or [employer]  Vionic  SAS Can be purchased at [employer] or [employer]   For work shoes recommend: [employer] Work [employer]  Can be purchased at a variety of places or Engineer, maintenance (IT)   For casual shoes recommend: Vionic  Can be purchased at [employer] or [employer] spray to shoes monthly to prevent re-infection at area of wart

## 2018-12-16 ENCOUNTER — Ambulatory Visit: Payer: BC Managed Care – PPO | Admitting: Sports Medicine

## 2018-12-16 ENCOUNTER — Encounter: Payer: Self-pay | Admitting: Sports Medicine

## 2018-12-16 ENCOUNTER — Other Ambulatory Visit: Payer: Self-pay

## 2018-12-16 DIAGNOSIS — E119 Type 2 diabetes mellitus without complications: Secondary | ICD-10-CM | POA: Diagnosis not present

## 2018-12-16 DIAGNOSIS — M79671 Pain in right foot: Secondary | ICD-10-CM | POA: Diagnosis not present

## 2018-12-16 DIAGNOSIS — B070 Plantar wart: Secondary | ICD-10-CM | POA: Diagnosis not present

## 2018-12-16 DIAGNOSIS — L570 Actinic keratosis: Secondary | ICD-10-CM | POA: Diagnosis not present

## 2018-12-16 NOTE — Progress Notes (Signed)
Subjective: Candace Edwards is a 38 y.o. female patient seen today in office for follow-up evaluation of recurrent keratosis versus wart at the plantar right foot.  Patient reports that the area feels much better now she can walk has had issues with peeling skin but otherwise feels much improved patient has been using over-the-counter wart acid to the area without any issues.  Patient is diabetic. FBS per patient is 83.  Last A1c 6.0 saw Dr. Nyra Capes her primary care doctor few months ago  There are no active problems to display for this patient.   Current Outpatient Medications on File Prior to Visit  Medication Sig Dispense Refill  . busPIRone (BUSPAR) 15 MG tablet     . albuterol (VENTOLIN HFA) 108 (90 Base) MCG/ACT inhaler INHALE 2 PUFFS BY MOUTH EVERY 4 TO 6 HOURS AS NEEDED FOR WHEEZING    . furosemide (LASIX) 20 MG tablet Take by mouth.    Marland Kitchen JANUMET 50-500 MG tablet Take 1 tablet by mouth daily.    . Multiple Vitamin (MULTIVITAMIN) tablet Take 1 tablet by mouth daily. womens daily/lc    . omeprazole (PRILOSEC) 40 MG capsule Take 40 mg by mouth daily.    . salicylic acid 17 % gel Apply topically daily. 15 g 0  . simvastatin (ZOCOR) 20 MG tablet TAKE 1 TABLET BY MOUTH TWICE A WEEK     No current facility-administered medications on file prior to visit.     Allergies  Allergen Reactions  . Amoxicillin   . Erythromycin   . Guaifenesin Other (See Comments)    Eyes Swelling  . Levaquin [Levofloxacin In D5w]   . Maxiphen [Phenylephrine-Guaifenesin]   . Penicillins     Objective: There were no vitals filed for this visit.  General: No acute distress, AAOx3  Dermatology: Appears to be resolved wart at plantar lateral right foot with loose peeling keratotic skin present.  At plantar lateral right foot there is old scar that is well-healed however diffusely on the bottoms of both feet there is dry skin and buildup of callus tissue.  No acute signs of infection.  Neurovascular:  Protective sensation intact neurovascular structures are intact.  Capillary fill time to toes intact.  Musculoskeletal: Pes planus foot type.  No reproducible pain to the previous wart area on the plantar aspect of the right foot.    Assessment and Plan:  Problem List Items Addressed This Visit    None    Visit Diagnoses    Plantar wart    -  Primary   Keratosis       Right foot pain       Diabetes mellitus without complication ([place])          -Patient seen and evaluated -Advised daily inspection of feet in setting of diabetes  -Mechanically debrided reactive keratotic tissue at the plantar lateral right foot using a chisel blade without incident and applied salidocaine covered with a Band-Aid -Recommend use pumice stone and continue with over-the-counter wart cream until there is no more peeling skin to the area -Patient to return to office as needed as needed if there is any reoccurrence of pain or symptoms to the area. In the meantime, patient to call office if any issues or problems arise.   Landis Martins, DPM

## 2018-12-28 DIAGNOSIS — Z20828 Contact with and (suspected) exposure to other viral communicable diseases: Secondary | ICD-10-CM | POA: Diagnosis not present

## 2018-12-28 DIAGNOSIS — U071 COVID-19: Secondary | ICD-10-CM | POA: Diagnosis not present

## 2019-01-05 DIAGNOSIS — E1169 Type 2 diabetes mellitus with other specified complication: Secondary | ICD-10-CM | POA: Diagnosis not present

## 2019-01-11 DIAGNOSIS — F331 Major depressive disorder, recurrent, moderate: Secondary | ICD-10-CM | POA: Diagnosis not present

## 2019-01-11 DIAGNOSIS — E1169 Type 2 diabetes mellitus with other specified complication: Secondary | ICD-10-CM | POA: Diagnosis not present

## 2019-01-11 DIAGNOSIS — E785 Hyperlipidemia, unspecified: Secondary | ICD-10-CM | POA: Diagnosis not present

## 2019-02-15 DIAGNOSIS — U071 COVID-19: Secondary | ICD-10-CM | POA: Diagnosis not present

## 2019-02-15 DIAGNOSIS — Z20828 Contact with and (suspected) exposure to other viral communicable diseases: Secondary | ICD-10-CM | POA: Diagnosis not present

## 2019-02-22 DIAGNOSIS — Z20828 Contact with and (suspected) exposure to other viral communicable diseases: Secondary | ICD-10-CM | POA: Diagnosis not present

## 2019-02-22 DIAGNOSIS — U071 COVID-19: Secondary | ICD-10-CM | POA: Diagnosis not present

## 2019-03-08 DIAGNOSIS — U071 COVID-19: Secondary | ICD-10-CM | POA: Diagnosis not present

## 2019-03-08 DIAGNOSIS — Z20828 Contact with and (suspected) exposure to other viral communicable diseases: Secondary | ICD-10-CM | POA: Diagnosis not present

## 2019-03-15 DIAGNOSIS — Z20828 Contact with and (suspected) exposure to other viral communicable diseases: Secondary | ICD-10-CM | POA: Diagnosis not present

## 2019-03-15 DIAGNOSIS — U071 COVID-19: Secondary | ICD-10-CM | POA: Diagnosis not present

## 2019-03-22 DIAGNOSIS — U071 COVID-19: Secondary | ICD-10-CM | POA: Diagnosis not present

## 2019-03-22 DIAGNOSIS — Z20828 Contact with and (suspected) exposure to other viral communicable diseases: Secondary | ICD-10-CM | POA: Diagnosis not present

## 2019-03-29 DIAGNOSIS — U071 COVID-19: Secondary | ICD-10-CM | POA: Diagnosis not present

## 2019-03-29 DIAGNOSIS — Z20828 Contact with and (suspected) exposure to other viral communicable diseases: Secondary | ICD-10-CM | POA: Diagnosis not present

## 2019-04-05 DIAGNOSIS — U071 COVID-19: Secondary | ICD-10-CM | POA: Diagnosis not present

## 2019-04-05 DIAGNOSIS — Z20828 Contact with and (suspected) exposure to other viral communicable diseases: Secondary | ICD-10-CM | POA: Diagnosis not present

## 2019-04-12 DIAGNOSIS — Z20828 Contact with and (suspected) exposure to other viral communicable diseases: Secondary | ICD-10-CM | POA: Diagnosis not present

## 2019-04-12 DIAGNOSIS — U071 COVID-19: Secondary | ICD-10-CM | POA: Diagnosis not present

## 2019-05-04 DIAGNOSIS — Z20828 Contact with and (suspected) exposure to other viral communicable diseases: Secondary | ICD-10-CM | POA: Diagnosis not present

## 2019-05-04 DIAGNOSIS — U071 COVID-19: Secondary | ICD-10-CM | POA: Diagnosis not present

## 2019-05-05 DIAGNOSIS — E1169 Type 2 diabetes mellitus with other specified complication: Secondary | ICD-10-CM | POA: Diagnosis not present

## 2019-05-12 DIAGNOSIS — E1169 Type 2 diabetes mellitus with other specified complication: Secondary | ICD-10-CM | POA: Diagnosis not present

## 2019-05-12 DIAGNOSIS — E1140 Type 2 diabetes mellitus with diabetic neuropathy, unspecified: Secondary | ICD-10-CM | POA: Diagnosis not present

## 2019-05-12 DIAGNOSIS — E785 Hyperlipidemia, unspecified: Secondary | ICD-10-CM | POA: Diagnosis not present

## 2019-05-12 DIAGNOSIS — F331 Major depressive disorder, recurrent, moderate: Secondary | ICD-10-CM | POA: Diagnosis not present

## 2019-06-10 DIAGNOSIS — U071 COVID-19: Secondary | ICD-10-CM | POA: Diagnosis not present

## 2019-07-04 DIAGNOSIS — N611 Abscess of the breast and nipple: Secondary | ICD-10-CM | POA: Diagnosis not present

## 2019-07-05 ENCOUNTER — Other Ambulatory Visit: Payer: Self-pay | Admitting: Surgery

## 2019-07-05 ENCOUNTER — Ambulatory Visit
Admission: RE | Admit: 2019-07-05 | Discharge: 2019-07-05 | Disposition: A | Discharge status: Home / Self Care | Payer: Self-pay | Source: Ambulatory Visit | Attending: Surgery | Admitting: Surgery

## 2019-07-05 ENCOUNTER — Other Ambulatory Visit: Payer: Self-pay

## 2019-07-05 DIAGNOSIS — L0291 Cutaneous abscess, unspecified: Secondary | ICD-10-CM

## 2019-07-05 DIAGNOSIS — N611 Abscess of the breast and nipple: Secondary | ICD-10-CM | POA: Diagnosis not present

## 2019-07-11 LAB — AEROBIC/ANAEROBIC CULTURE W GRAM STAIN (SURGICAL/DEEP WOUND): Culture: NORMAL

## 2019-07-12 ENCOUNTER — Other Ambulatory Visit: Payer: Self-pay | Admitting: Surgery

## 2019-07-12 ENCOUNTER — Other Ambulatory Visit: Payer: Self-pay

## 2019-07-12 ENCOUNTER — Ambulatory Visit
Admission: RE | Admit: 2019-07-12 | Discharge: 2019-07-12 | Disposition: A | Discharge status: Home / Self Care | Payer: Self-pay | Source: Ambulatory Visit | Attending: Surgery | Admitting: Surgery

## 2019-07-12 DIAGNOSIS — N611 Abscess of the breast and nipple: Secondary | ICD-10-CM | POA: Diagnosis not present

## 2019-07-12 DIAGNOSIS — L0291 Cutaneous abscess, unspecified: Secondary | ICD-10-CM

## 2019-08-09 DIAGNOSIS — E1169 Type 2 diabetes mellitus with other specified complication: Secondary | ICD-10-CM | POA: Diagnosis not present

## 2019-08-16 DIAGNOSIS — E1140 Type 2 diabetes mellitus with diabetic neuropathy, unspecified: Secondary | ICD-10-CM | POA: Diagnosis not present

## 2019-08-16 DIAGNOSIS — K219 Gastro-esophageal reflux disease without esophagitis: Secondary | ICD-10-CM | POA: Diagnosis not present

## 2019-08-16 DIAGNOSIS — E1169 Type 2 diabetes mellitus with other specified complication: Secondary | ICD-10-CM | POA: Diagnosis not present

## 2019-08-16 DIAGNOSIS — E785 Hyperlipidemia, unspecified: Secondary | ICD-10-CM | POA: Diagnosis not present

## 2019-08-30 DIAGNOSIS — R3915 Urgency of urination: Secondary | ICD-10-CM | POA: Diagnosis not present

## 2019-08-30 DIAGNOSIS — N23 Unspecified renal colic: Secondary | ICD-10-CM | POA: Diagnosis not present

## 2019-08-30 DIAGNOSIS — Z79899 Other long term (current) drug therapy: Secondary | ICD-10-CM | POA: Diagnosis not present

## 2019-08-30 DIAGNOSIS — Z853 Personal history of malignant neoplasm of breast: Secondary | ICD-10-CM | POA: Diagnosis not present

## 2019-08-30 DIAGNOSIS — Z6841 Body Mass Index (BMI) 40.0 and over, adult: Secondary | ICD-10-CM | POA: Diagnosis not present

## 2019-08-30 DIAGNOSIS — M545 Low back pain: Secondary | ICD-10-CM | POA: Diagnosis not present

## 2019-08-30 DIAGNOSIS — R109 Unspecified abdominal pain: Secondary | ICD-10-CM | POA: Diagnosis not present

## 2019-08-30 DIAGNOSIS — Z8541 Personal history of malignant neoplasm of cervix uteri: Secondary | ICD-10-CM | POA: Diagnosis not present

## 2019-08-30 DIAGNOSIS — F17210 Nicotine dependence, cigarettes, uncomplicated: Secondary | ICD-10-CM | POA: Diagnosis not present

## 2019-08-30 DIAGNOSIS — Z9010 Acquired absence of unspecified breast and nipple: Secondary | ICD-10-CM | POA: Diagnosis not present

## 2019-08-30 DIAGNOSIS — F419 Anxiety disorder, unspecified: Secondary | ICD-10-CM | POA: Diagnosis not present

## 2019-08-30 DIAGNOSIS — E119 Type 2 diabetes mellitus without complications: Secondary | ICD-10-CM | POA: Diagnosis not present

## 2019-08-30 DIAGNOSIS — N390 Urinary tract infection, site not specified: Secondary | ICD-10-CM | POA: Diagnosis not present

## 2019-08-30 DIAGNOSIS — Z7984 Long term (current) use of oral hypoglycemic drugs: Secondary | ICD-10-CM | POA: Diagnosis not present

## 2019-08-30 DIAGNOSIS — K219 Gastro-esophageal reflux disease without esophagitis: Secondary | ICD-10-CM | POA: Diagnosis not present

## 2019-08-30 DIAGNOSIS — E669 Obesity, unspecified: Secondary | ICD-10-CM | POA: Diagnosis not present

## 2019-10-05 DIAGNOSIS — U071 COVID-19: Secondary | ICD-10-CM | POA: Diagnosis not present

## 2019-10-13 DIAGNOSIS — U071 COVID-19: Secondary | ICD-10-CM | POA: Diagnosis not present

## 2019-10-13 DIAGNOSIS — Z20828 Contact with and (suspected) exposure to other viral communicable diseases: Secondary | ICD-10-CM | POA: Diagnosis not present

## 2019-10-17 ENCOUNTER — Inpatient Hospital Stay: Admission: RE | Admit: 2019-10-17 | Payer: BC Managed Care – PPO | Source: Ambulatory Visit

## 2019-10-19 DIAGNOSIS — U071 COVID-19: Secondary | ICD-10-CM | POA: Diagnosis not present

## 2019-10-25 DIAGNOSIS — U071 COVID-19: Secondary | ICD-10-CM | POA: Diagnosis not present

## 2019-10-25 DIAGNOSIS — Z20828 Contact with and (suspected) exposure to other viral communicable diseases: Secondary | ICD-10-CM | POA: Diagnosis not present

## 2019-10-31 DIAGNOSIS — Z20828 Contact with and (suspected) exposure to other viral communicable diseases: Secondary | ICD-10-CM | POA: Diagnosis not present

## 2019-10-31 DIAGNOSIS — U071 COVID-19: Secondary | ICD-10-CM | POA: Diagnosis not present

## 2019-11-02 DIAGNOSIS — U071 COVID-19: Secondary | ICD-10-CM | POA: Diagnosis not present

## 2019-11-08 DIAGNOSIS — U071 COVID-19: Secondary | ICD-10-CM | POA: Diagnosis not present

## 2019-11-08 DIAGNOSIS — Z20828 Contact with and (suspected) exposure to other viral communicable diseases: Secondary | ICD-10-CM | POA: Diagnosis not present

## 2019-11-14 DIAGNOSIS — U071 COVID-19: Secondary | ICD-10-CM | POA: Diagnosis not present

## 2019-11-14 DIAGNOSIS — Z20828 Contact with and (suspected) exposure to other viral communicable diseases: Secondary | ICD-10-CM | POA: Diagnosis not present

## 2019-11-21 DIAGNOSIS — Z20828 Contact with and (suspected) exposure to other viral communicable diseases: Secondary | ICD-10-CM | POA: Diagnosis not present

## 2019-11-21 DIAGNOSIS — U071 COVID-19: Secondary | ICD-10-CM | POA: Diagnosis not present

## 2019-12-14 DIAGNOSIS — E1169 Type 2 diabetes mellitus with other specified complication: Secondary | ICD-10-CM | POA: Diagnosis not present

## 2019-12-22 DIAGNOSIS — E1169 Type 2 diabetes mellitus with other specified complication: Secondary | ICD-10-CM | POA: Diagnosis not present

## 2019-12-22 DIAGNOSIS — E1140 Type 2 diabetes mellitus with diabetic neuropathy, unspecified: Secondary | ICD-10-CM | POA: Diagnosis not present

## 2019-12-22 DIAGNOSIS — E785 Hyperlipidemia, unspecified: Secondary | ICD-10-CM | POA: Diagnosis not present

## 2020-02-06 DIAGNOSIS — F17210 Nicotine dependence, cigarettes, uncomplicated: Secondary | ICD-10-CM | POA: Diagnosis not present

## 2020-02-06 DIAGNOSIS — R079 Chest pain, unspecified: Secondary | ICD-10-CM | POA: Diagnosis not present

## 2020-02-06 DIAGNOSIS — E119 Type 2 diabetes mellitus without complications: Secondary | ICD-10-CM | POA: Diagnosis not present

## 2020-02-06 DIAGNOSIS — Z853 Personal history of malignant neoplasm of breast: Secondary | ICD-10-CM | POA: Diagnosis not present

## 2020-02-06 DIAGNOSIS — K219 Gastro-esophageal reflux disease without esophagitis: Secondary | ICD-10-CM | POA: Diagnosis not present

## 2020-02-06 DIAGNOSIS — Z9010 Acquired absence of unspecified breast and nipple: Secondary | ICD-10-CM | POA: Diagnosis not present

## 2020-02-06 DIAGNOSIS — F419 Anxiety disorder, unspecified: Secondary | ICD-10-CM | POA: Diagnosis not present

## 2020-02-06 DIAGNOSIS — Z7984 Long term (current) use of oral hypoglycemic drugs: Secondary | ICD-10-CM | POA: Diagnosis not present

## 2020-02-06 DIAGNOSIS — Z79899 Other long term (current) drug therapy: Secondary | ICD-10-CM | POA: Diagnosis not present

## 2020-02-06 DIAGNOSIS — Z8541 Personal history of malignant neoplasm of cervix uteri: Secondary | ICD-10-CM | POA: Diagnosis not present

## 2020-03-12 DIAGNOSIS — Z20822 Contact with and (suspected) exposure to covid-19: Secondary | ICD-10-CM | POA: Diagnosis not present

## 2020-04-17 DIAGNOSIS — E1169 Type 2 diabetes mellitus with other specified complication: Secondary | ICD-10-CM | POA: Diagnosis not present

## 2020-04-24 DIAGNOSIS — F331 Major depressive disorder, recurrent, moderate: Secondary | ICD-10-CM | POA: Diagnosis not present

## 2020-04-24 DIAGNOSIS — E1169 Type 2 diabetes mellitus with other specified complication: Secondary | ICD-10-CM | POA: Diagnosis not present

## 2020-04-24 DIAGNOSIS — F419 Anxiety disorder, unspecified: Secondary | ICD-10-CM | POA: Diagnosis not present

## 2020-04-24 DIAGNOSIS — E785 Hyperlipidemia, unspecified: Secondary | ICD-10-CM | POA: Diagnosis not present

## 2020-05-29 DIAGNOSIS — F17210 Nicotine dependence, cigarettes, uncomplicated: Secondary | ICD-10-CM | POA: Diagnosis not present

## 2020-05-29 DIAGNOSIS — K219 Gastro-esophageal reflux disease without esophagitis: Secondary | ICD-10-CM | POA: Diagnosis not present

## 2020-05-29 DIAGNOSIS — Z720 Tobacco use: Secondary | ICD-10-CM | POA: Diagnosis not present

## 2020-05-29 DIAGNOSIS — F419 Anxiety disorder, unspecified: Secondary | ICD-10-CM | POA: Diagnosis not present

## 2020-05-29 DIAGNOSIS — F331 Major depressive disorder, recurrent, moderate: Secondary | ICD-10-CM | POA: Diagnosis not present

## 2020-06-26 DIAGNOSIS — Z6841 Body Mass Index (BMI) 40.0 and over, adult: Secondary | ICD-10-CM | POA: Diagnosis not present

## 2020-06-26 DIAGNOSIS — Z01419 Encounter for gynecological examination (general) (routine) without abnormal findings: Secondary | ICD-10-CM | POA: Diagnosis not present

## 2020-06-26 DIAGNOSIS — Z0000 Encounter for general adult medical examination without abnormal findings: Secondary | ICD-10-CM | POA: Diagnosis not present

## 2020-08-14 DIAGNOSIS — E1169 Type 2 diabetes mellitus with other specified complication: Secondary | ICD-10-CM | POA: Diagnosis not present

## 2020-08-21 DIAGNOSIS — E1169 Type 2 diabetes mellitus with other specified complication: Secondary | ICD-10-CM | POA: Diagnosis not present

## 2020-08-21 DIAGNOSIS — E785 Hyperlipidemia, unspecified: Secondary | ICD-10-CM | POA: Diagnosis not present

## 2020-08-21 DIAGNOSIS — F17210 Nicotine dependence, cigarettes, uncomplicated: Secondary | ICD-10-CM | POA: Diagnosis not present

## 2020-08-21 DIAGNOSIS — F331 Major depressive disorder, recurrent, moderate: Secondary | ICD-10-CM | POA: Diagnosis not present

## 2020-08-21 DIAGNOSIS — Z720 Tobacco use: Secondary | ICD-10-CM | POA: Diagnosis not present

## 2020-08-21 DIAGNOSIS — E1140 Type 2 diabetes mellitus with diabetic neuropathy, unspecified: Secondary | ICD-10-CM | POA: Diagnosis not present

## 2020-09-13 IMAGING — US US BREAST CYST ASPIRATION 1ST CYST
1 series · 2 of 2 positions shown · non-contrast
Comparison: Previous exams.
COMPARISON: Previous exams.

Addendum:
CLINICAL DATA: 39-year-old female for aspiration of LEFT breast
abscess.

EXAM:
ULTRASOUND GUIDED LEFT BREAST ASPIRATION

[Series 1: us breast cyst aspiration 1st cyst · 0.07mm/px · 2 of 2 slices shown]
[im 1/2]
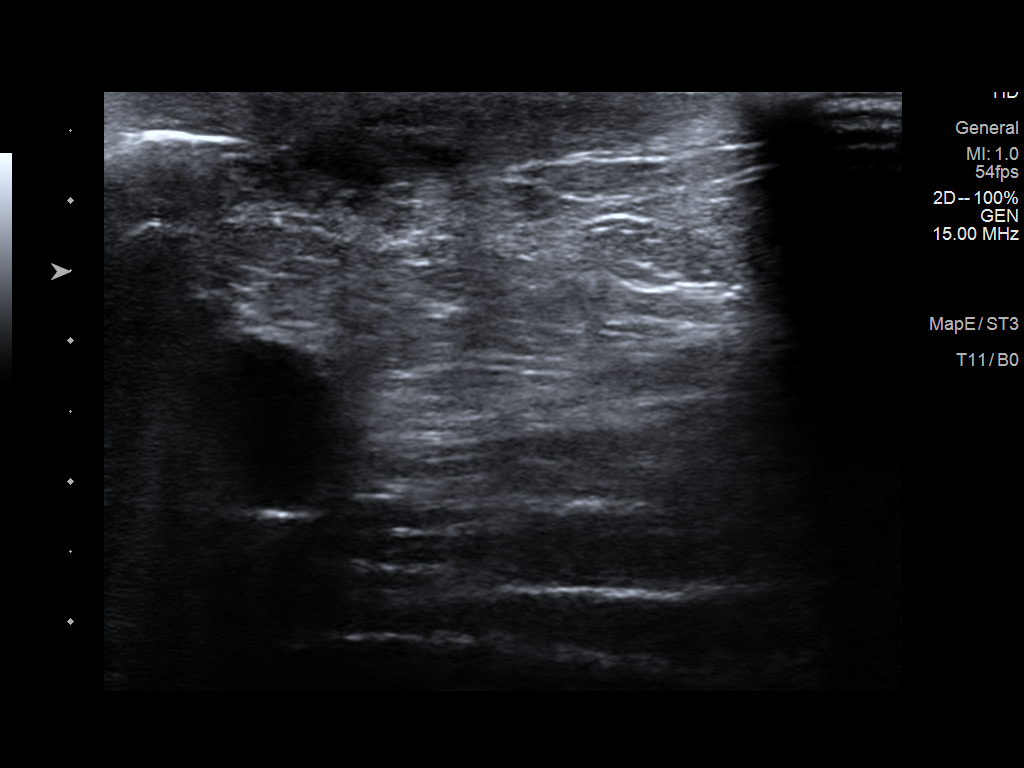
[im 2/2]
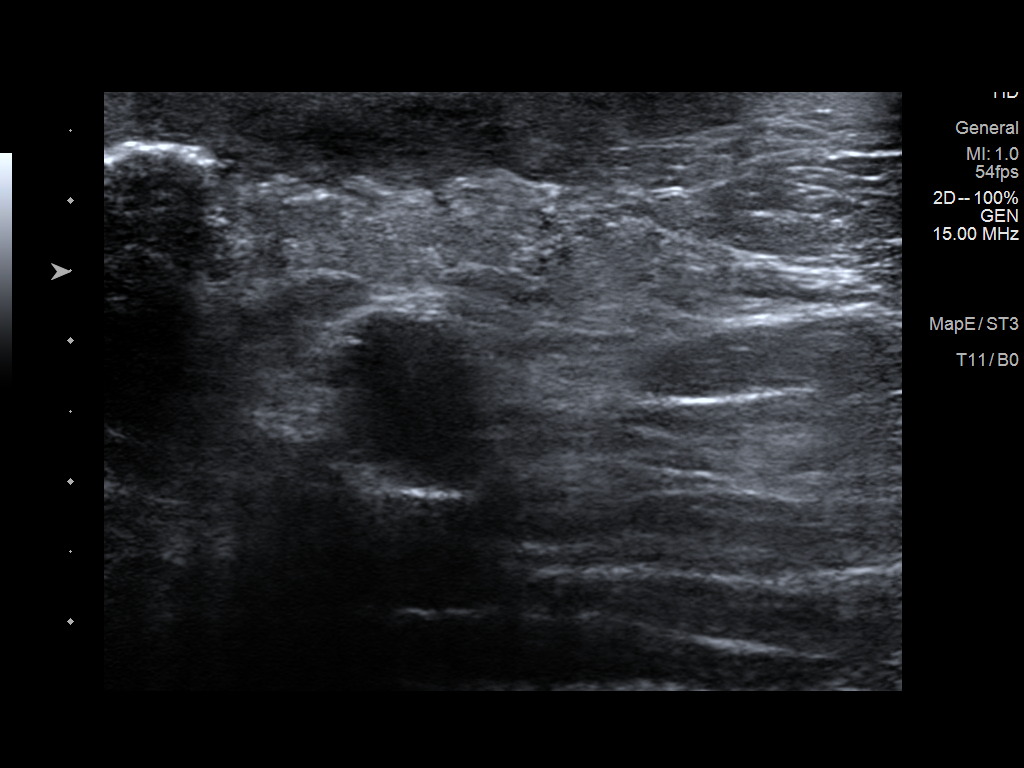

[2 of 2 positions shown; findings below may reference images not displayed]

PROCEDURE:
Using sterile technique, 1% lidocaine, under direct ultrasound
visualization, needle aspiration of the 2.3 cm RETROAREOLAR LEFT
breast abscess was performed. Only 1.5 cc of pus was able to be
aspirated from the abscess, with the sample sent to the laboratory
for further evaluation. A 1.4 x 0.6 cm residual collection/abscess
remains.
IMPRESSION: Ultrasound-guided aspiration of RETROAREOLAR LEFT breast abscess.
1.4 x 0.6 cm residual collection/abscess. No apparent complications.

RECOMMENDATIONS:
LEFT breast ultrasound follow-up in 4-7 days, which will be
scheduled.

ADDENDUM:
LEFT breast aspiration yielded Gram Stain-ABUNDANT WBC PRESENT,
PREDOMINANTLY PMN, ABUNDANT GRAM POSITIVE COCCI. Culture-MODERATE
NORMAL SKIN JEHN, MODERATE FINEGOLDIA MAGNA.

The patient was notified of results in person by Dr. Genijalka Iglic
on July 12, 2019 at the time of a LEFT breast ultrasound. This
procedure is dictated in a separate report.

The patient reported the redness and pain have resolved with a
palpable area still present at the affected area. She was encouraged
to take her antibiotics as prescribed.

The patient is scheduled to return to The [facility] on October 19, 2019 for Left breast ultrasound
with possible aspiration to ensure continued healing and resolution
of the abscess. She was instructed to call for any additional
questions/concerns, or if the palpable abnormality in the breast is
enlarging and she feels as if she is developing a breast abscess.

Pathology results reported by Eii Seba Esteticista, RN on 07/13/2019.

*** End of Addendum ***
PROCEDURE:
Using sterile technique, 1% lidocaine, under direct ultrasound
visualization, needle aspiration of the 2.3 cm RETROAREOLAR LEFT
breast abscess was performed. Only 1.5 cc of pus was able to be
aspirated from the abscess, with the sample sent to the laboratory
for further evaluation. A 1.4 x 0.6 cm residual collection/abscess
remains.
IMPRESSION: Ultrasound-guided aspiration of RETROAREOLAR LEFT breast abscess.
1.4 x 0.6 cm residual collection/abscess. No apparent complications.

RECOMMENDATIONS:
LEFT breast ultrasound follow-up in 4-7 days, which will be
scheduled.

## 2020-09-13 IMAGING — MG MM DIGITAL DIAGNOSTIC UNILAT*L* W/ TOMO W/ CAD
6 series · 6 of 18 positions shown · non-contrast
Comparison: Previous exam(s).

CLINICAL DATA: 39-year-old female with LEFT breast pain and
swelling. History of RIGHT breast cancer and mastectomy 10 years ago
and LEFT breast reduction. Patient has been on clindamycin for 2
days.

EXAM:
DIGITAL DIAGNOSTIC LEFT MAMMOGRAM WITH CAD AND TOMO
ULTRASOUND LEFT BREAST

[L CC synth-2D]
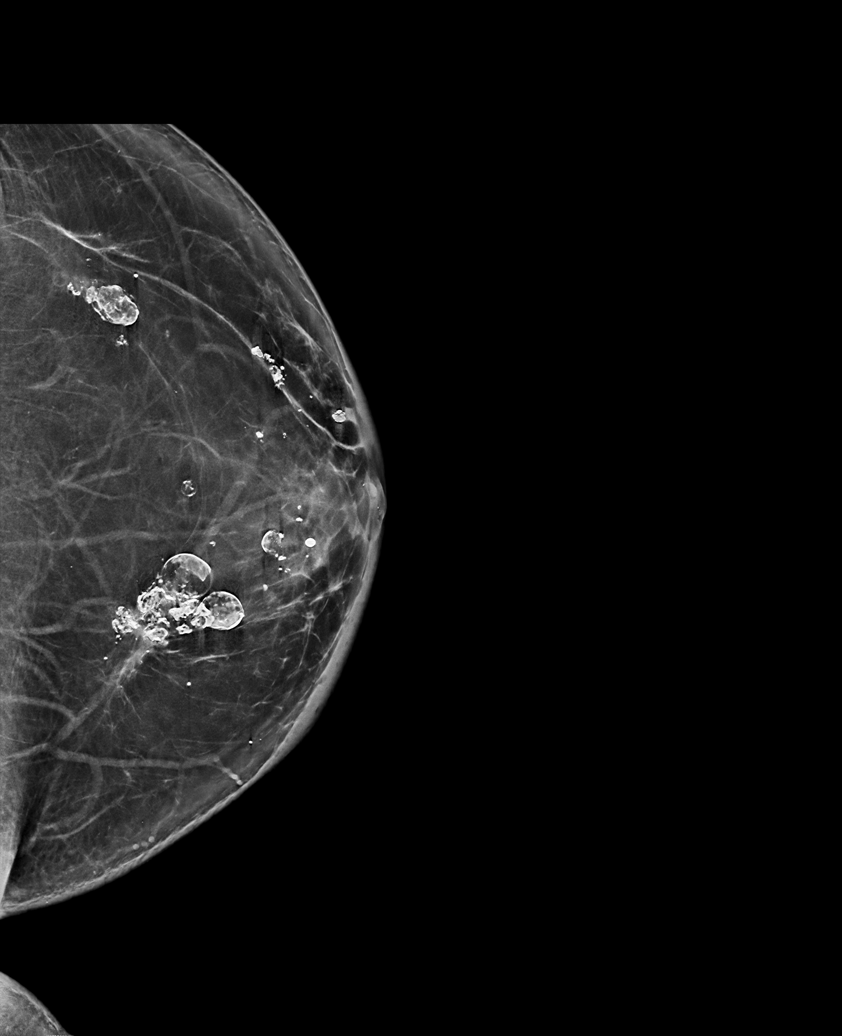

[L MLO synth-2D]
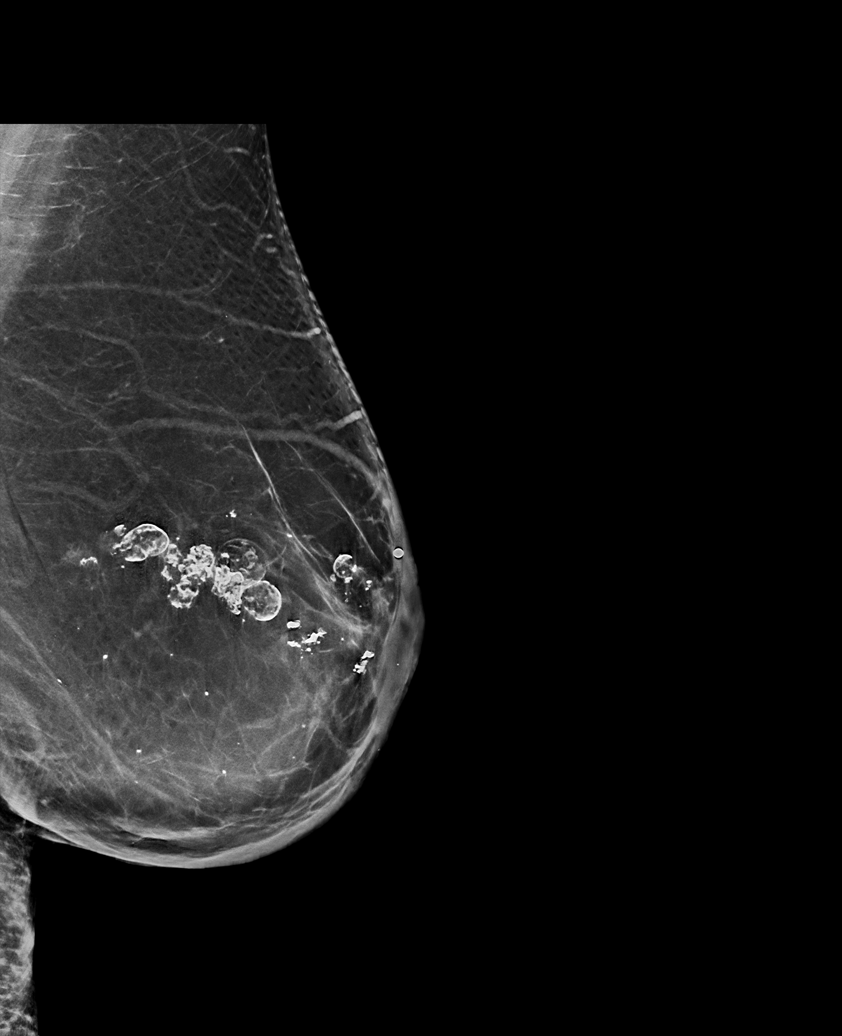

[L TAN synth-2D]
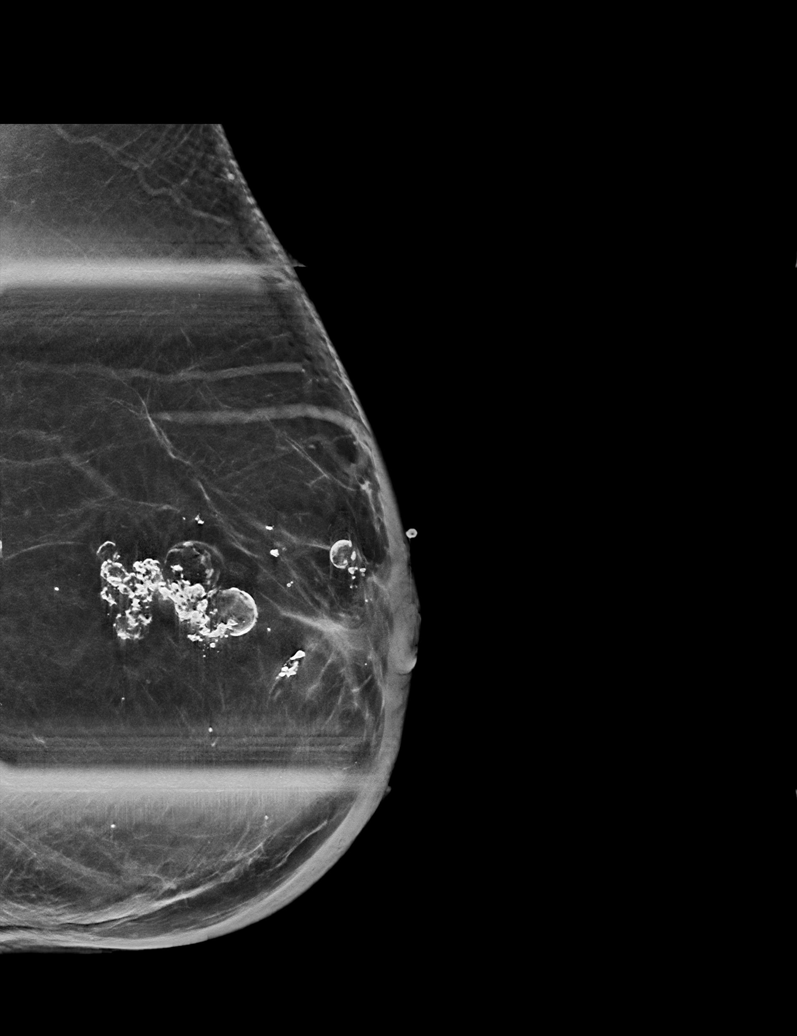

[L TAN tomo · tomo slice 39/77.0]
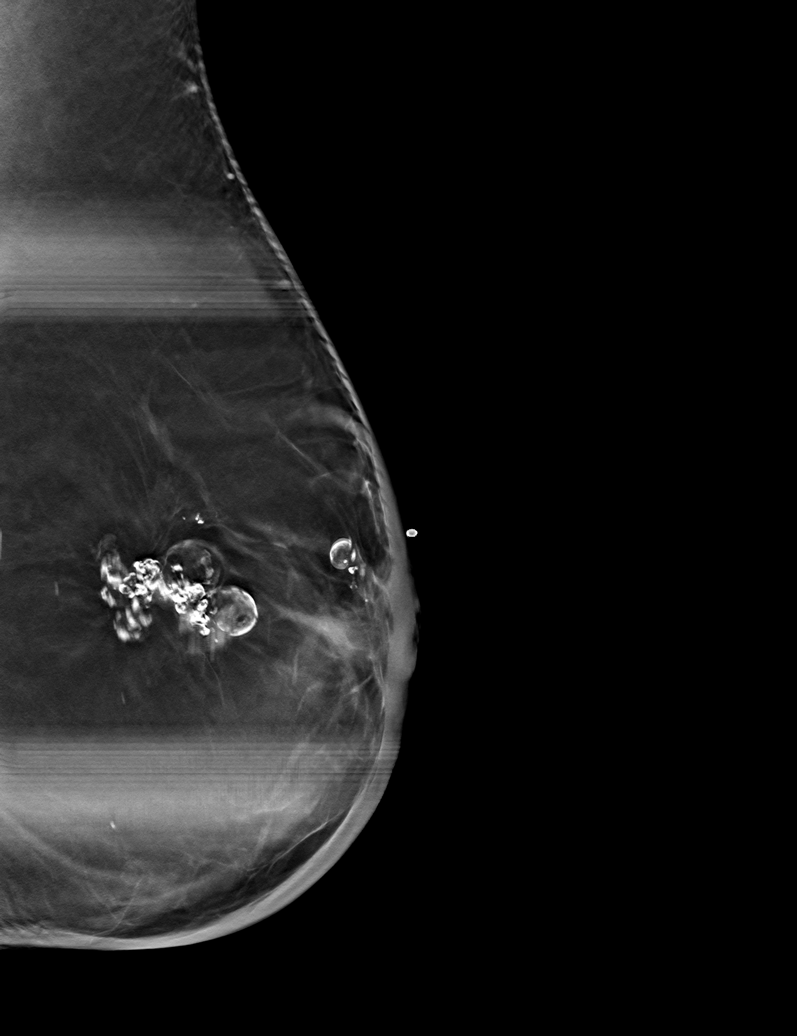

[L MLO tomo · tomo slice 47/94.0]
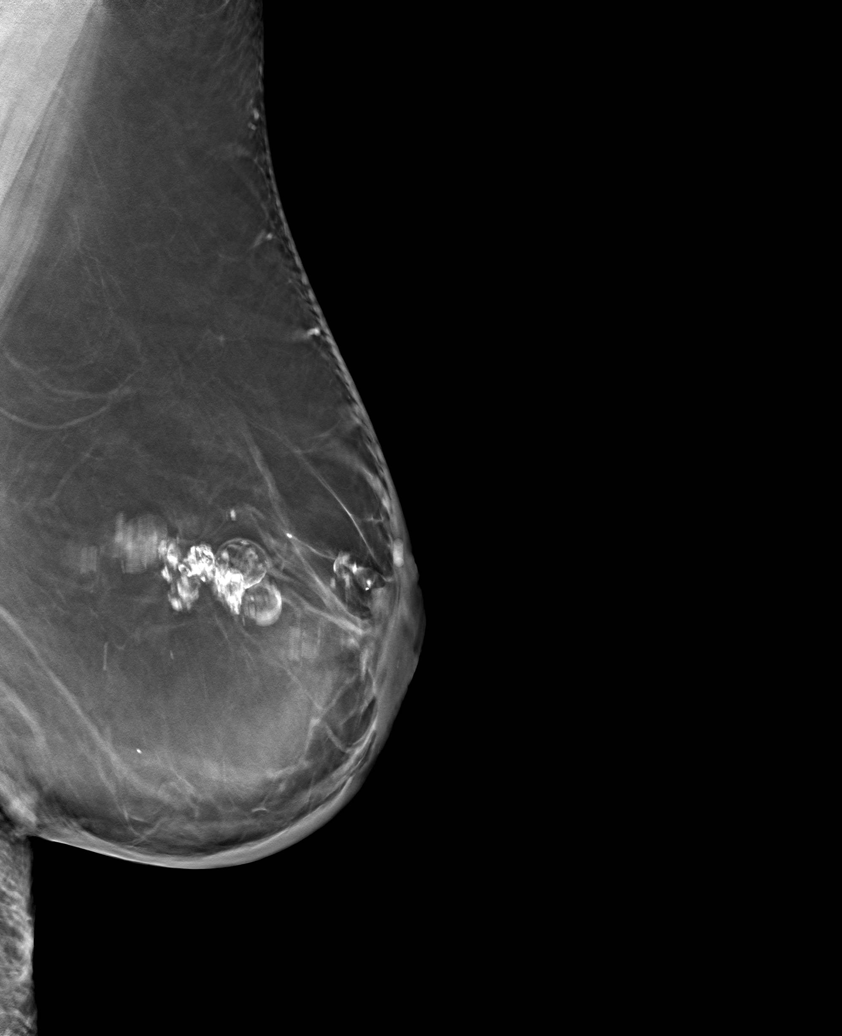

[L CC tomo · tomo slice 41/82.0]
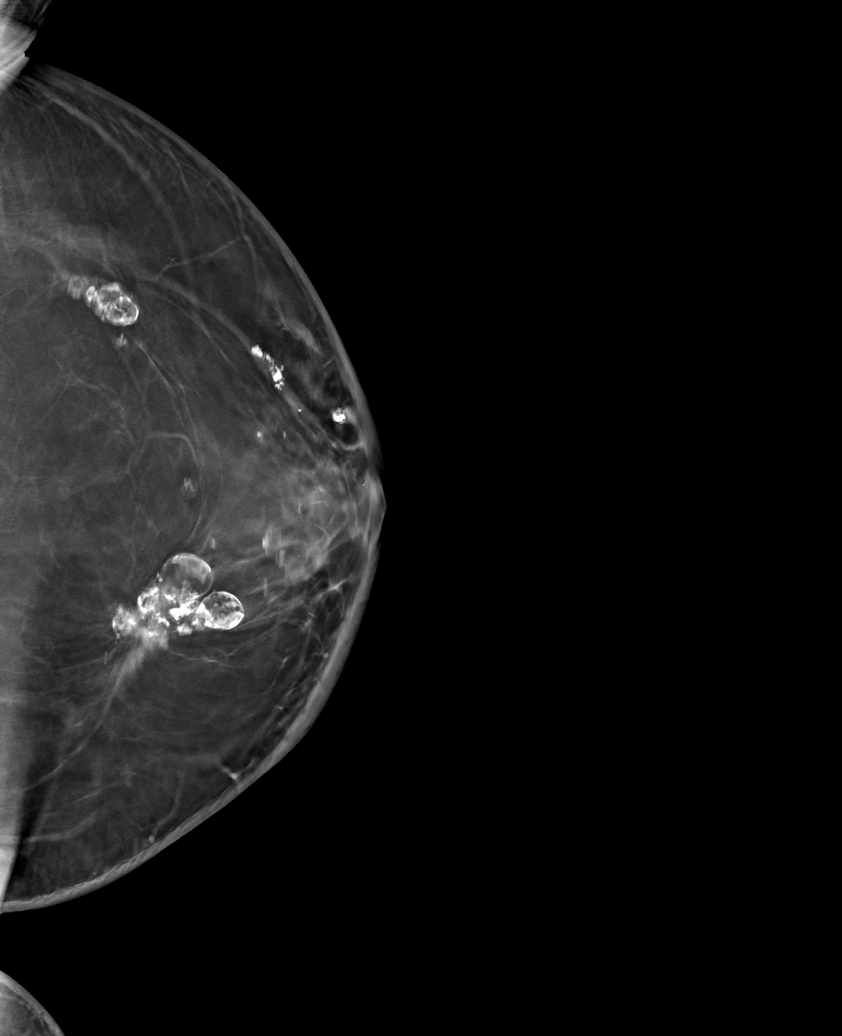

[6 of 18 positions shown; findings below may reference images not displayed]

ACR Breast Density Category b: There are scattered areas of
fibroglandular density.
FINDINGS: 2D/3D full field and spot compression views of the LEFT breast
demonstrate increased density in the anterior LEFT breast with mild
overlying skin thickening.

Dystrophic calcifications within the LEFT breast are again noted.

Mammographic images were processed with CAD.

On physical exam, erythema of the anterior LEFT breast identified
with focal thickening in the RETROAREOLAR region.

Targeted ultrasound is performed, showing a 2.3 x 0.8 x 1.7 cm
collection in the RETROAREOLAR LEFT breast compatible with an
abscess.
IMPRESSION: 1. 2.3 cm RETROAREOLAR LEFT breast abscess. Aspiration will be
performed.
2. No evidence of breast malignancy.

RECOMMENDATION:
Ultrasound-guided LEFT breast aspiration, which will be performed
today but dictated in a separate report.

Patient continue antibiotics.

I have discussed the findings and recommendations with the patient.
If applicable, a reminder letter will be sent to the patient
regarding the next appointment.

BI-RADS CATEGORY  2: Benign.

## 2020-09-20 IMAGING — US US BREAST*L* LIMITED INC AXILLA
1 series · 6 of 6 positions shown · non-contrast
Comparison: Previous exams.

CLINICAL DATA: 39-year-old female presents for follow-up left
retroareolar abscess. The patient initially presented 07/05/2019
with a 2.3 cm collection. Subsequent aspiration was performed with
fluid for culture. This returned as Gram positive cocci with
moderate finegoldia magna. The patient states the erythema and pain
have resolved, however she does continue to feel a palpable
abnormality in the retroareolar left breast.

EXAM:
ULTRASOUND OF THE LEFT BREAST

[Series 1: us breast*left* limited inc axilla · 0.06mm/px · 6 of 6 slices shown]
[im 1/6]
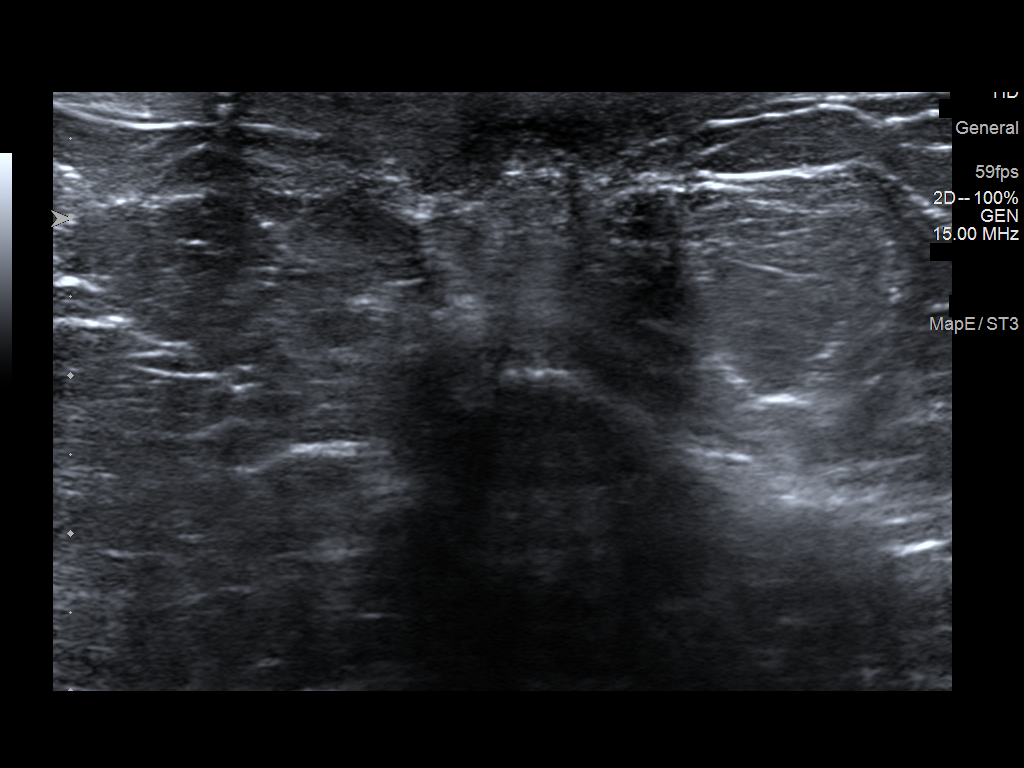
[im 2/6]
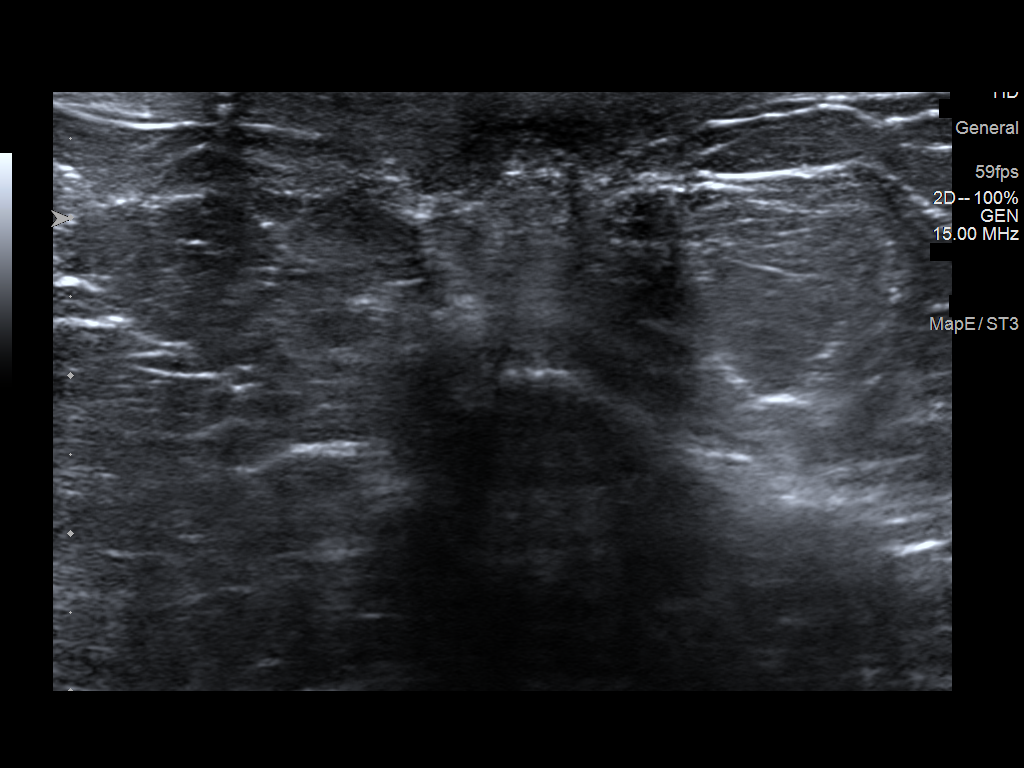
[im 3/6]
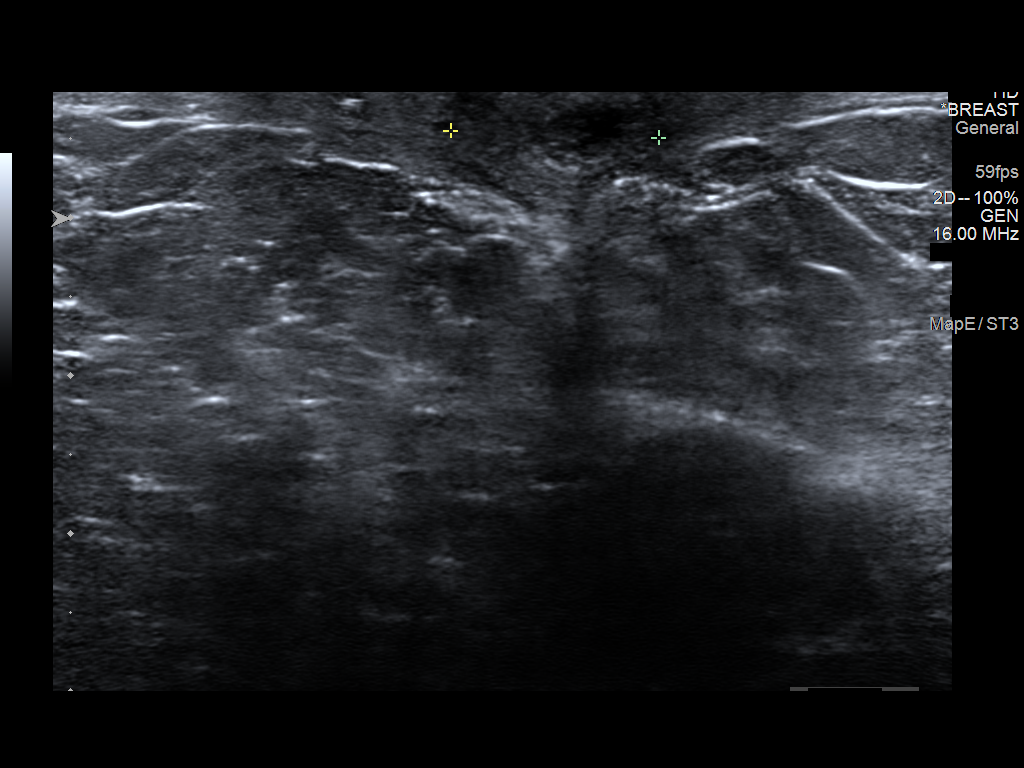
[im 4/6]
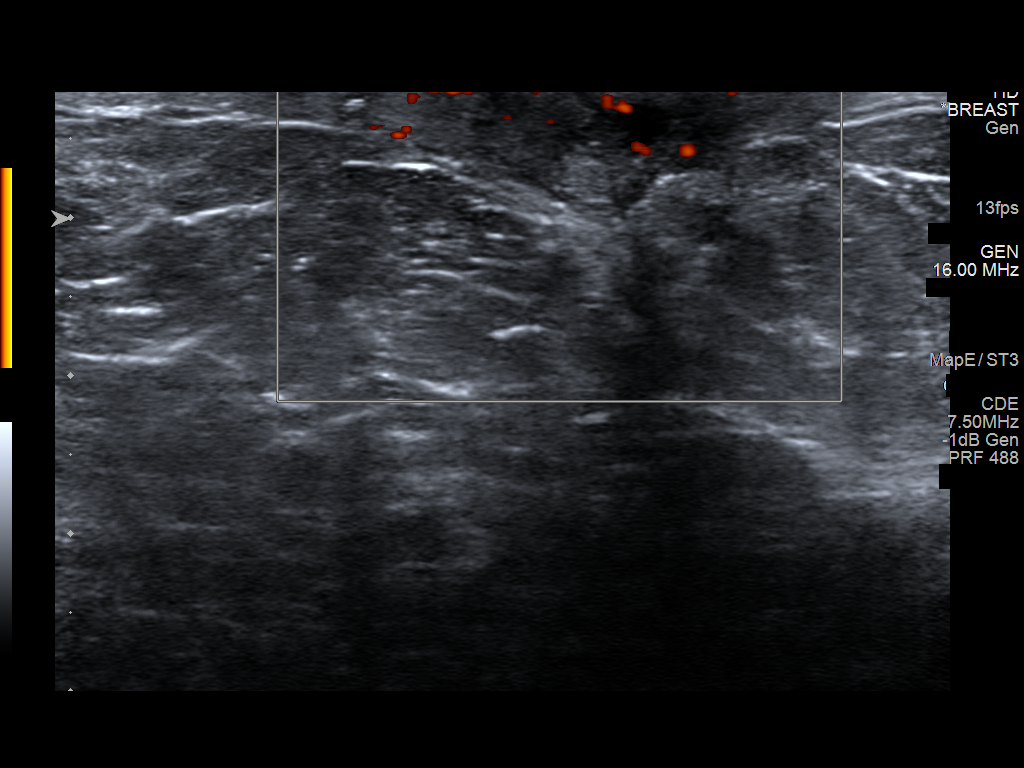
[im 5/6]
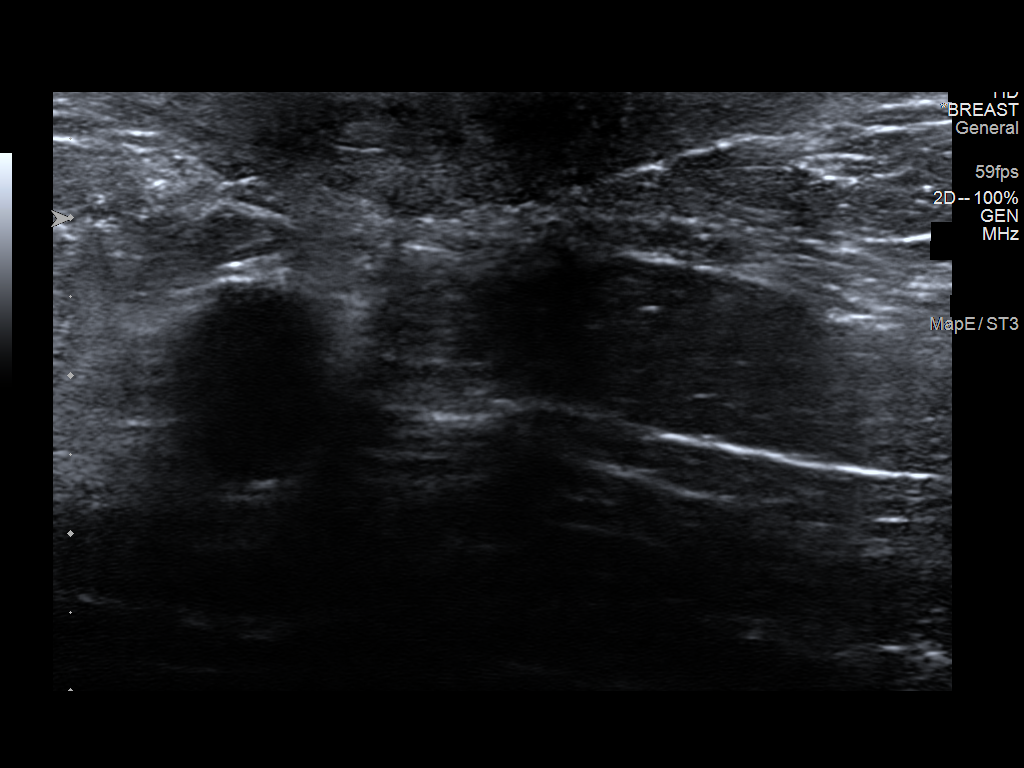
[im 6/6]
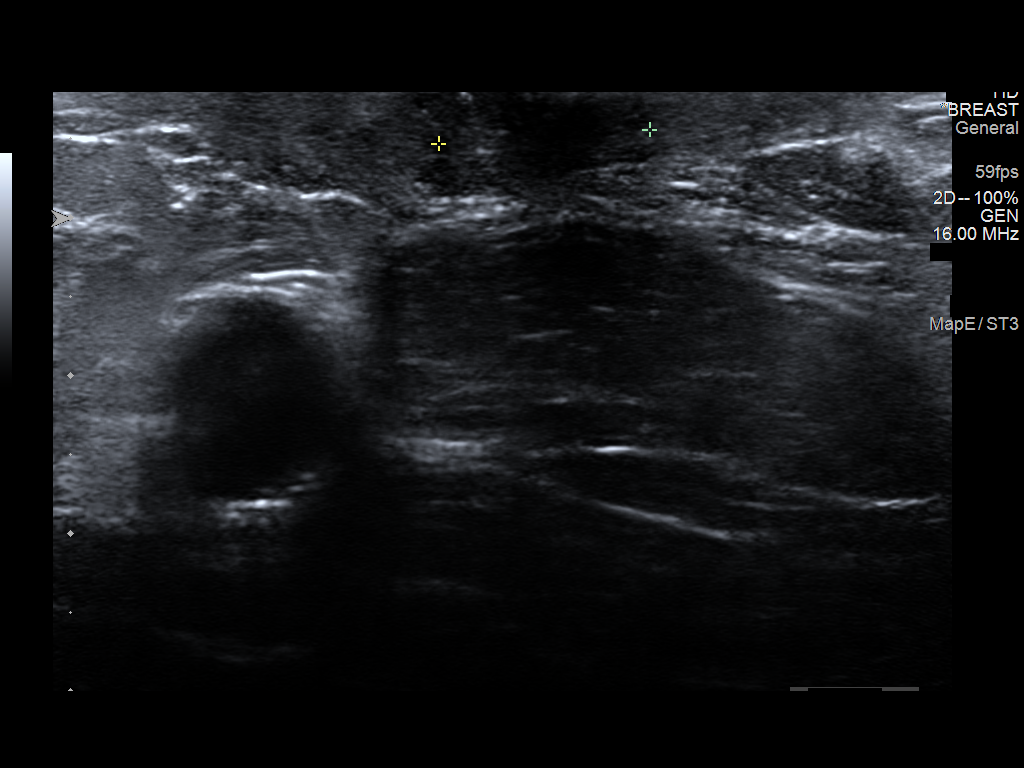

[6 of 6 positions shown; findings below may reference images not displayed]

FINDINGS: Physical examination reveals a firm mass/thickening at the
approximate 9-10 o'clock retroareolar. There is no erythema. No pain
with palpation. No purulent discharge noted.

Targeted ultrasound of the retroareolar left breast was performed
demonstrating skin/areolar thickening with a small area of likely
phlegmonous change measuring up to 1.3 cm, similar in appearance
when compared to the post aspiration images from 07/05/2019. No new
or drainable collections identified.
IMPRESSION: Resolving left breast abscess.  No drainable collection.

RECOMMENDATION:
1. The patient has been prescribed a 4 week course of clindamycin,
however she states this is difficult to take 3 times per day and is
causing GI symptoms. She has been encouraged to continue the
antibiotics for at least 3 more days so that she has a total of 10
days of treatment. If the patient's symptoms are not improving or
worsening, consider switching antibiotics to Metronidazole which has
been reported to be effective in the treatment of Finegoldia
infections. She has also been encouraged to discontinue/reduce
tobacco use as this is a risk factor for development of breast
abscesses.

2. Recommend the patient return in approximately 12 weeks for a
final follow-up ultrasound to ensure resolution of the sonographic
findings. If the palpable abnormality in the left breast is
enlarging and she feels as if she is developing in the the breast
abscess, she should return sooner.

I have discussed the findings and recommendations with the patient.
If applicable, a reminder letter will be sent to the patient
regarding the next appointment.

BI-RADS CATEGORY  3: Probably benign.

## 2020-10-19 DIAGNOSIS — L03119 Cellulitis of unspecified part of limb: Secondary | ICD-10-CM | POA: Diagnosis not present

## 2020-10-19 DIAGNOSIS — Z6841 Body Mass Index (BMI) 40.0 and over, adult: Secondary | ICD-10-CM | POA: Diagnosis not present

## 2020-10-19 DIAGNOSIS — R52 Pain, unspecified: Secondary | ICD-10-CM | POA: Diagnosis not present

## 2020-10-19 DIAGNOSIS — L03311 Cellulitis of abdominal wall: Secondary | ICD-10-CM | POA: Diagnosis not present

## 2020-10-19 DIAGNOSIS — Z5321 Procedure and treatment not carried out due to patient leaving prior to being seen by health care provider: Secondary | ICD-10-CM | POA: Diagnosis not present

## 2020-12-20 DIAGNOSIS — E1169 Type 2 diabetes mellitus with other specified complication: Secondary | ICD-10-CM | POA: Diagnosis not present

## 2020-12-27 DIAGNOSIS — E785 Hyperlipidemia, unspecified: Secondary | ICD-10-CM | POA: Diagnosis not present

## 2020-12-27 DIAGNOSIS — Z6841 Body Mass Index (BMI) 40.0 and over, adult: Secondary | ICD-10-CM | POA: Diagnosis not present

## 2020-12-27 DIAGNOSIS — E1169 Type 2 diabetes mellitus with other specified complication: Secondary | ICD-10-CM | POA: Diagnosis not present

## 2021-03-07 DIAGNOSIS — E1169 Type 2 diabetes mellitus with other specified complication: Secondary | ICD-10-CM | POA: Diagnosis not present

## 2021-03-13 DIAGNOSIS — E1140 Type 2 diabetes mellitus with diabetic neuropathy, unspecified: Secondary | ICD-10-CM | POA: Diagnosis not present

## 2021-03-13 DIAGNOSIS — Z1331 Encounter for screening for depression: Secondary | ICD-10-CM | POA: Diagnosis not present

## 2021-03-13 DIAGNOSIS — E785 Hyperlipidemia, unspecified: Secondary | ICD-10-CM | POA: Diagnosis not present

## 2021-03-13 DIAGNOSIS — F331 Major depressive disorder, recurrent, moderate: Secondary | ICD-10-CM | POA: Diagnosis not present

## 2021-03-13 DIAGNOSIS — E1169 Type 2 diabetes mellitus with other specified complication: Secondary | ICD-10-CM | POA: Diagnosis not present

## 2021-05-22 ENCOUNTER — Ambulatory Visit: Payer: BC Managed Care – PPO | Admitting: Sports Medicine

## 2021-05-22 ENCOUNTER — Other Ambulatory Visit: Payer: Self-pay

## 2021-05-22 ENCOUNTER — Encounter: Payer: Self-pay | Admitting: Sports Medicine

## 2021-05-22 DIAGNOSIS — L989 Disorder of the skin and subcutaneous tissue, unspecified: Secondary | ICD-10-CM

## 2021-05-22 DIAGNOSIS — U071 COVID-19: Secondary | ICD-10-CM | POA: Insufficient documentation

## 2021-05-22 DIAGNOSIS — Z209 Contact with and (suspected) exposure to unspecified communicable disease: Secondary | ICD-10-CM | POA: Insufficient documentation

## 2021-05-22 DIAGNOSIS — M79672 Pain in left foot: Secondary | ICD-10-CM | POA: Diagnosis not present

## 2021-05-22 DIAGNOSIS — K219 Gastro-esophageal reflux disease without esophagitis: Secondary | ICD-10-CM | POA: Insufficient documentation

## 2021-05-22 DIAGNOSIS — E119 Type 2 diabetes mellitus without complications: Secondary | ICD-10-CM

## 2021-05-22 DIAGNOSIS — B070 Plantar wart: Secondary | ICD-10-CM | POA: Diagnosis not present

## 2021-05-22 DIAGNOSIS — E162 Hypoglycemia, unspecified: Secondary | ICD-10-CM | POA: Insufficient documentation

## 2021-05-22 NOTE — Progress Notes (Signed)
Subjective: ?Candace Edwards is a 41 y.o. female patient who presents to office for evaluation of plantar left foot pain states that is been hurting for the last several months the longer she stands or walks in shoes it is painful.  Patient reports that she has noticed buildup of callus skin but has not tried to trim it or do anything to it especially since she is diabetic.  Patient reports that she has lost over 60 pounds and has been keeping her A1c under control. ? ?Patient Active Problem List  ? Diagnosis Date Noted  ? COVID-19 05/22/2021  ? Exposure to communicable disease 05/22/2021  ? Gastro-esophageal reflux disease without esophagitis 05/22/2021  ? Hypoglycemia 05/22/2021  ? Morbid (severe) obesity due to excess calories (HCC) 05/22/2021  ? Acute appendicitis with localized peritonitis 10/23/2015  ? ? ?Current Outpatient Medications on File Prior to Visit  ?Medication Sig Dispense Refill  ? gabapentin (NEURONTIN) 100 MG capsule Take by mouth.    ? rosuvastatin (CRESTOR) 20 MG tablet Take by mouth.    ? FARXIGA 10 MG TABS tablet Take 10 mg by mouth daily.    ? furosemide (LASIX) 20 MG tablet Take by mouth.    ? MOUNJARO 5 MG/0.5ML Pen SMARTSIG:1 pre-filled pen syringe SUB-Q Once a Week    ? Multiple Vitamin (MULTIVITAMIN) tablet Take 1 tablet by mouth daily. womens daily/lc    ? omeprazole (PRILOSEC) 40 MG capsule Take 40 mg by mouth daily.    ? ?No current facility-administered medications on file prior to visit.  ? ? ?Allergies  ?Allergen Reactions  ? Amoxicillin   ? Erythromycin   ? Guaifenesin Other (See Comments)  ?  Eyes Swelling  ? Levaquin [Levofloxacin In D5w]   ? Maxiphen [Phenylephrine-Guaifenesin]   ? Penicillins   ? ? ?Objective:  ?General: Alert and oriented x3 in no acute distress ? ?Dermatology: Keratotic lesion present plantar fifth metatarsal head > second metatarsal head on the left foot with skin lines transversing the lesion, pain is present with direct pressure to the lesion with  a central nucleated core noted, no webspace macerations, no ecchymosis bilateral, all nails x 10 are well manicured. ? ?Vascular: Dorsalis Pedis and Posterior Tibial pedal pulses 2/4, Capillary Fill Time 3 seconds, + pedal hair growth bilateral, no edema bilateral lower extremities, Temperature gradient within normal limits. ? ?Neurology: Gross sensation intact via light touch bilateral.  Subjective numbness noted to the plantar lateral right foot. ? ?Musculoskeletal: Mild tenderness with palpation at the keratotic lesion site x2 on left. ? ?Assessment and Plan: ?Problem List Items Addressed This Visit   ?None ?Visit Diagnoses   ? ? Benign skin lesion    -  Primary  ? Plantar wart of left foot      ? Left foot pain      ? Diabetes mellitus without complication (HCC)      ? Relevant Medications  ? FARXIGA 10 MG TABS tablet  ? rosuvastatin (CRESTOR) 20 MG tablet  ? MOUNJARO 5 MG/0.5ML Pen  ? ?  ? ? ?-Complete examination performed ?-Discussed treatment options ?-Parred keratoic lesion using a 15 blade x2 on the plantar surface of the left foot; treated the area with small amount of Cantharone at the plantar fifth metatarsal head lesion on the left cover with Band-Aid advised patient on blistering reaction if occurs apply Neosporin and Band-Aid as directed ?-Encouraged daily skin emollients ?-Advised good supportive shoes and inserts ?-Patient to return to office in 8-9 weeks to  or sooner if condition worsens. ? ?Asencion Islam, DPM ?

## 2021-06-04 DIAGNOSIS — E1169 Type 2 diabetes mellitus with other specified complication: Secondary | ICD-10-CM | POA: Diagnosis not present

## 2021-06-12 DIAGNOSIS — F419 Anxiety disorder, unspecified: Secondary | ICD-10-CM | POA: Diagnosis not present

## 2021-06-12 DIAGNOSIS — K219 Gastro-esophageal reflux disease without esophagitis: Secondary | ICD-10-CM | POA: Diagnosis not present

## 2021-06-12 DIAGNOSIS — E1169 Type 2 diabetes mellitus with other specified complication: Secondary | ICD-10-CM | POA: Diagnosis not present

## 2021-06-12 DIAGNOSIS — E785 Hyperlipidemia, unspecified: Secondary | ICD-10-CM | POA: Diagnosis not present

## 2021-07-10 DIAGNOSIS — Z23 Encounter for immunization: Secondary | ICD-10-CM | POA: Diagnosis not present

## 2021-07-10 DIAGNOSIS — N6320 Unspecified lump in the left breast, unspecified quadrant: Secondary | ICD-10-CM | POA: Diagnosis not present

## 2021-07-10 DIAGNOSIS — Z0000 Encounter for general adult medical examination without abnormal findings: Secondary | ICD-10-CM | POA: Diagnosis not present

## 2021-07-10 DIAGNOSIS — Z6841 Body Mass Index (BMI) 40.0 and over, adult: Secondary | ICD-10-CM | POA: Diagnosis not present

## 2021-07-24 ENCOUNTER — Ambulatory Visit: Payer: BC Managed Care – PPO | Admitting: Sports Medicine

## 2021-07-24 ENCOUNTER — Encounter: Payer: Self-pay | Admitting: Sports Medicine

## 2021-07-24 DIAGNOSIS — B070 Plantar wart: Secondary | ICD-10-CM | POA: Diagnosis not present

## 2021-07-24 DIAGNOSIS — E119 Type 2 diabetes mellitus without complications: Secondary | ICD-10-CM

## 2021-07-24 DIAGNOSIS — L989 Disorder of the skin and subcutaneous tissue, unspecified: Secondary | ICD-10-CM

## 2021-07-24 DIAGNOSIS — M79672 Pain in left foot: Secondary | ICD-10-CM

## 2021-07-24 MED ORDER — FLUOROURACIL 5 % EX CREA: TOPICAL_CREAM | CUTANEOUS | 0 refills | Status: AC

## 2021-07-24 NOTE — Progress Notes (Signed)
Subjective: Candace Edwards is a 41 y.o. female patient who presents to office for follow up evaluation of plantar left foot pain at skin lesion, reports that it started hurting again about 2 weeks ago. Denies any other pedal complaints at this time.   Patient Active Problem List   Diagnosis Date Noted   COVID-19 05/22/2021   Exposure to communicable disease 05/22/2021   Gastro-esophageal reflux disease without esophagitis 05/22/2021   Hypoglycemia 05/22/2021   Morbid (severe) obesity due to excess calories (HCC) 05/22/2021   Acute appendicitis with localized peritonitis 10/23/2015    Current Outpatient Medications on File Prior to Visit  Medication Sig Dispense Refill   FARXIGA 10 MG TABS tablet Take 10 mg by mouth daily.     furosemide (LASIX) 20 MG tablet Take by mouth.     gabapentin (NEURONTIN) 100 MG capsule Take by mouth.     MOUNJARO 5 MG/0.5ML Pen SMARTSIG:1 pre-filled pen syringe SUB-Q Once a Week     Multiple Vitamin (MULTIVITAMIN) tablet Take 1 tablet by mouth daily. womens daily/lc     omeprazole (PRILOSEC) 40 MG capsule Take 40 mg by mouth daily.     rosuvastatin (CRESTOR) 20 MG tablet Take by mouth.     No current facility-administered medications on file prior to visit.    Allergies  Allergen Reactions   Amoxicillin    Erythromycin    Guaifenesin Other (See Comments)    Eyes Swelling   Levaquin [Levofloxacin In D5w]    Maxiphen [Phenylephrine-Guaifenesin]    Penicillins     Objective:  General: Alert and oriented x3 in no acute distress  Dermatology: Keratotic lesion present plantar fifth metatarsal head > second metatarsal head on the left foot with skin lines transversing the lesion, pain is present with direct pressure to the lesion with a central nucleated core noted, no webspace macerations, no ecchymosis bilateral, all nails x 10 are well manicured.  Vascular: Dorsalis Pedis and Posterior Tibial pedal pulses 2/4, Capillary Fill Time 3 seconds, +  pedal hair growth bilateral, no edema bilateral lower extremities, Temperature gradient within normal limits.  Neurology: Gross sensation intact via light touch bilateral.  Subjective numbness noted to the plantar lateral right foot.  Musculoskeletal: Mild tenderness with palpation at the keratotic lesion site x2 on left.  Assessment and Plan: Problem List Items Addressed This Visit   None Visit Diagnoses     Benign skin lesion    -  Primary   Plantar wart of left foot       Left foot pain       Diabetes mellitus without complication (HCC)           -Complete examination performed -Re-Discussed treatment options -Parred keratoic lesion using a 15 blade x2 on the plantar surface of the left foot; treated the area with small amount of Cantharone at the plantar fifth metatarsal head lesion on the left cover with Band-Aid advised patient on blistering reaction if occurs apply Neosporin and Band-Aid as directed -Rx efudex cream for patient to use to the area once a week starting next week  -Encouraged daily skin emollients -Advised good supportive shoes and inserts -Patient to return to office in 8-9 weeks to or sooner if condition worsens.  Asencion Islam, DPM

## 2021-07-25 DIAGNOSIS — R928 Other abnormal and inconclusive findings on diagnostic imaging of breast: Secondary | ICD-10-CM | POA: Diagnosis not present

## 2021-07-25 DIAGNOSIS — N6489 Other specified disorders of breast: Secondary | ICD-10-CM | POA: Diagnosis not present

## 2021-07-25 DIAGNOSIS — N63 Unspecified lump in unspecified breast: Secondary | ICD-10-CM | POA: Diagnosis not present

## 2021-07-25 DIAGNOSIS — R921 Mammographic calcification found on diagnostic imaging of breast: Secondary | ICD-10-CM | POA: Diagnosis not present

## 2021-07-26 ENCOUNTER — Ambulatory Visit: Payer: BC Managed Care – PPO | Admitting: Sports Medicine

## 2021-08-19 DIAGNOSIS — R030 Elevated blood-pressure reading, without diagnosis of hypertension: Secondary | ICD-10-CM | POA: Diagnosis not present

## 2021-08-19 DIAGNOSIS — Z6841 Body Mass Index (BMI) 40.0 and over, adult: Secondary | ICD-10-CM | POA: Diagnosis not present

## 2021-08-19 DIAGNOSIS — S31104A Unspecified open wound of abdominal wall, left lower quadrant without penetration into peritoneal cavity, initial encounter: Secondary | ICD-10-CM | POA: Diagnosis not present

## 2021-09-04 DIAGNOSIS — E1169 Type 2 diabetes mellitus with other specified complication: Secondary | ICD-10-CM | POA: Diagnosis not present

## 2021-09-11 DIAGNOSIS — E785 Hyperlipidemia, unspecified: Secondary | ICD-10-CM | POA: Diagnosis not present

## 2021-09-11 DIAGNOSIS — R609 Edema, unspecified: Secondary | ICD-10-CM | POA: Diagnosis not present

## 2021-09-11 DIAGNOSIS — Z6841 Body Mass Index (BMI) 40.0 and over, adult: Secondary | ICD-10-CM | POA: Diagnosis not present

## 2021-09-11 DIAGNOSIS — E1169 Type 2 diabetes mellitus with other specified complication: Secondary | ICD-10-CM | POA: Diagnosis not present

## 2021-09-11 DIAGNOSIS — K219 Gastro-esophageal reflux disease without esophagitis: Secondary | ICD-10-CM | POA: Diagnosis not present

## 2021-09-11 DIAGNOSIS — N6320 Unspecified lump in the left breast, unspecified quadrant: Secondary | ICD-10-CM | POA: Diagnosis not present

## 2021-09-16 ENCOUNTER — Ambulatory Visit: Payer: BC Managed Care – PPO | Admitting: Podiatry

## 2021-09-16 ENCOUNTER — Encounter: Payer: Self-pay | Admitting: Podiatry

## 2021-09-16 DIAGNOSIS — B070 Plantar wart: Secondary | ICD-10-CM | POA: Diagnosis not present

## 2021-09-16 DIAGNOSIS — L989 Disorder of the skin and subcutaneous tissue, unspecified: Secondary | ICD-10-CM

## 2021-09-16 NOTE — Progress Notes (Signed)
Subjective: Candace Edwards is a 41 y.o. female patient who presents to office for follow up evaluation of plantar left foot pain at skin lesion, reports that relief only astes about 4 weeks last trimming.  Denies any other pedal complaints at this time.   Patient Active Problem List   Diagnosis Date Noted   COVID-19 05/22/2021   Exposure to communicable disease 05/22/2021   Gastro-esophageal reflux disease without esophagitis 05/22/2021   Hypoglycemia 05/22/2021   Morbid (severe) obesity due to excess calories (HCC) 05/22/2021   Acute appendicitis with localized peritonitis 10/23/2015    Current Outpatient Medications on File Prior to Visit  Medication Sig Dispense Refill   FARXIGA 10 MG TABS tablet Take 10 mg by mouth daily.     fluorouracil (EFUDEX) 5 % cream Once a week covered with bandaid to warts left foot 40 g 0   furosemide (LASIX) 20 MG tablet Take by mouth.     gabapentin (NEURONTIN) 100 MG capsule Take by mouth.     MOUNJARO 5 MG/0.5ML Pen SMARTSIG:1 pre-filled pen syringe SUB-Q Once a Week     Multiple Vitamin (MULTIVITAMIN) tablet Take 1 tablet by mouth daily. womens daily/lc     omeprazole (PRILOSEC) 40 MG capsule Take 40 mg by mouth daily.     rosuvastatin (CRESTOR) 20 MG tablet Take by mouth.     SitaGLIPtin-MetFORMIN HCl [phone] MG TB24 Take 1 tablet by mouth daily.     No current facility-administered medications on file prior to visit.    Allergies  Allergen Reactions   Amoxicillin    Erythromycin    Guaifenesin Other (See Comments)    Eyes Swelling   Levaquin [Levofloxacin In D5w]    Maxiphen [Phenylephrine-Guaifenesin]    Penicillins     Objective:  General: Alert and oriented x3 in no acute distress  Dermatology: Keratotic lesion present plantar fifth metatarsal head > second metatarsal head on the left foot with skin lines transversing the lesion, pain is present with direct pressure to the lesion with a central nucleated core noted, no  webspace macerations, no ecchymosis bilateral, all nails x 10 are well manicured.  Vascular: Dorsalis Pedis and Posterior Tibial pedal pulses 2/4, Capillary Fill Time 3 seconds, + pedal hair growth bilateral, no edema bilateral lower extremities, Temperature gradient within normal limits.  Neurology: Gross sensation intact via light touch bilateral.  Subjective numbness noted to the plantar lateral right foot.  Musculoskeletal: Mild tenderness with palpation at the keratotic lesion site x2 on left.  Assessment and Plan: Problem List Items Addressed This Visit   None Visit Diagnoses     Benign skin lesion    -  Primary   Plantar wart of left foot           -Complete examination performed -Re-Discussed treatment options -Parred keratoic lesion using a 15 blade x2 on the plantar surface of the left foot; treated the area with small amount of salinocaine at the plantar fifth metatarsal head lesion on the left cover with Band-Aid  -Encouraged use of pummice stone.  -Discussed if continues to have problems could discuss surgery.  -Encouraged daily skin emollients -Advised good supportive shoes and inserts -Patient to return to office in 8-9 weeks to or sooner if condition worsens.  Louann Sjogren, DPM

## 2021-10-28 ENCOUNTER — Ambulatory Visit (INDEPENDENT_AMBULATORY_CARE_PROVIDER_SITE_OTHER): Payer: Self-pay | Admitting: Podiatry

## 2021-10-28 DIAGNOSIS — Z91199 Patient's noncompliance with other medical treatment and regimen due to unspecified reason: Secondary | ICD-10-CM

## 2021-10-28 NOTE — Progress Notes (Signed)
No show

## 2021-12-02 DIAGNOSIS — E1169 Type 2 diabetes mellitus with other specified complication: Secondary | ICD-10-CM | POA: Diagnosis not present

## 2021-12-02 DIAGNOSIS — E1140 Type 2 diabetes mellitus with diabetic neuropathy, unspecified: Secondary | ICD-10-CM | POA: Diagnosis not present

## 2021-12-11 DIAGNOSIS — Z6841 Body Mass Index (BMI) 40.0 and over, adult: Secondary | ICD-10-CM | POA: Diagnosis not present

## 2021-12-11 DIAGNOSIS — Z2820 Immunization not carried out because of patient decision for unspecified reason: Secondary | ICD-10-CM | POA: Diagnosis not present

## 2021-12-11 DIAGNOSIS — E785 Hyperlipidemia, unspecified: Secondary | ICD-10-CM | POA: Diagnosis not present

## 2021-12-11 DIAGNOSIS — E1169 Type 2 diabetes mellitus with other specified complication: Secondary | ICD-10-CM | POA: Diagnosis not present

## 2022-03-06 DIAGNOSIS — Z6841 Body Mass Index (BMI) 40.0 and over, adult: Secondary | ICD-10-CM | POA: Diagnosis not present

## 2022-03-06 DIAGNOSIS — F17210 Nicotine dependence, cigarettes, uncomplicated: Secondary | ICD-10-CM | POA: Diagnosis not present

## 2022-03-06 DIAGNOSIS — K529 Noninfective gastroenteritis and colitis, unspecified: Secondary | ICD-10-CM | POA: Diagnosis not present

## 2022-03-06 DIAGNOSIS — K769 Liver disease, unspecified: Secondary | ICD-10-CM | POA: Diagnosis not present

## 2022-03-06 DIAGNOSIS — R1011 Right upper quadrant pain: Secondary | ICD-10-CM | POA: Diagnosis not present

## 2022-03-06 DIAGNOSIS — K7689 Other specified diseases of liver: Secondary | ICD-10-CM | POA: Diagnosis not present

## 2022-03-06 DIAGNOSIS — E119 Type 2 diabetes mellitus without complications: Secondary | ICD-10-CM | POA: Diagnosis not present

## 2022-03-06 DIAGNOSIS — R109 Unspecified abdominal pain: Secondary | ICD-10-CM | POA: Diagnosis not present

## 2022-03-06 DIAGNOSIS — E669 Obesity, unspecified: Secondary | ICD-10-CM | POA: Diagnosis not present

## 2022-03-07 DIAGNOSIS — E1140 Type 2 diabetes mellitus with diabetic neuropathy, unspecified: Secondary | ICD-10-CM | POA: Diagnosis not present

## 2022-03-07 DIAGNOSIS — E1169 Type 2 diabetes mellitus with other specified complication: Secondary | ICD-10-CM | POA: Diagnosis not present

## 2022-03-12 DIAGNOSIS — Z6841 Body Mass Index (BMI) 40.0 and over, adult: Secondary | ICD-10-CM | POA: Diagnosis not present

## 2022-03-12 DIAGNOSIS — E785 Hyperlipidemia, unspecified: Secondary | ICD-10-CM | POA: Diagnosis not present

## 2022-03-12 DIAGNOSIS — E1169 Type 2 diabetes mellitus with other specified complication: Secondary | ICD-10-CM | POA: Diagnosis not present

## 2022-03-12 DIAGNOSIS — Z1331 Encounter for screening for depression: Secondary | ICD-10-CM | POA: Diagnosis not present

## 2022-03-12 DIAGNOSIS — R160 Hepatomegaly, not elsewhere classified: Secondary | ICD-10-CM | POA: Diagnosis not present

## 2022-03-24 DIAGNOSIS — K7689 Other specified diseases of liver: Secondary | ICD-10-CM | POA: Diagnosis not present

## 2022-03-24 DIAGNOSIS — R160 Hepatomegaly, not elsewhere classified: Secondary | ICD-10-CM | POA: Diagnosis not present

## 2022-03-27 DIAGNOSIS — Z720 Tobacco use: Secondary | ICD-10-CM | POA: Diagnosis not present

## 2022-03-27 DIAGNOSIS — R109 Unspecified abdominal pain: Secondary | ICD-10-CM | POA: Diagnosis not present

## 2022-03-27 DIAGNOSIS — T50905A Adverse effect of unspecified drugs, medicaments and biological substances, initial encounter: Secondary | ICD-10-CM | POA: Diagnosis not present

## 2022-03-27 DIAGNOSIS — D1803 Hemangioma of intra-abdominal structures: Secondary | ICD-10-CM | POA: Diagnosis not present

## 2022-03-27 DIAGNOSIS — Z6839 Body mass index (BMI) 39.0-39.9, adult: Secondary | ICD-10-CM | POA: Diagnosis not present

## 2022-03-27 DIAGNOSIS — E1169 Type 2 diabetes mellitus with other specified complication: Secondary | ICD-10-CM | POA: Diagnosis not present

## 2022-04-17 DIAGNOSIS — C50911 Malignant neoplasm of unspecified site of right female breast: Secondary | ICD-10-CM | POA: Diagnosis not present

## 2022-04-17 DIAGNOSIS — T8549XD Other mechanical complication of breast prosthesis and implant, subsequent encounter: Secondary | ICD-10-CM | POA: Diagnosis not present

## 2022-04-24 DIAGNOSIS — R1011 Right upper quadrant pain: Secondary | ICD-10-CM | POA: Diagnosis not present

## 2022-04-24 DIAGNOSIS — K219 Gastro-esophageal reflux disease without esophagitis: Secondary | ICD-10-CM | POA: Diagnosis not present

## 2022-06-13 DIAGNOSIS — E1140 Type 2 diabetes mellitus with diabetic neuropathy, unspecified: Secondary | ICD-10-CM | POA: Diagnosis not present

## 2022-06-13 DIAGNOSIS — E1169 Type 2 diabetes mellitus with other specified complication: Secondary | ICD-10-CM | POA: Diagnosis not present

## 2022-06-19 DIAGNOSIS — E785 Hyperlipidemia, unspecified: Secondary | ICD-10-CM | POA: Diagnosis not present

## 2022-06-19 DIAGNOSIS — Z6839 Body mass index (BMI) 39.0-39.9, adult: Secondary | ICD-10-CM | POA: Diagnosis not present

## 2022-06-19 DIAGNOSIS — K219 Gastro-esophageal reflux disease without esophagitis: Secondary | ICD-10-CM | POA: Diagnosis not present

## 2022-06-19 DIAGNOSIS — E1169 Type 2 diabetes mellitus with other specified complication: Secondary | ICD-10-CM | POA: Diagnosis not present

## 2022-07-02 DIAGNOSIS — K319 Disease of stomach and duodenum, unspecified: Secondary | ICD-10-CM | POA: Diagnosis not present

## 2022-07-02 DIAGNOSIS — K2930 Chronic superficial gastritis without bleeding: Secondary | ICD-10-CM | POA: Diagnosis not present

## 2022-07-02 DIAGNOSIS — R1084 Generalized abdominal pain: Secondary | ICD-10-CM | POA: Diagnosis not present

## 2022-07-02 DIAGNOSIS — R1011 Right upper quadrant pain: Secondary | ICD-10-CM | POA: Diagnosis not present

## 2022-07-18 DIAGNOSIS — R1084 Generalized abdominal pain: Secondary | ICD-10-CM | POA: Diagnosis not present

## 2022-07-18 DIAGNOSIS — K50019 Crohn's disease of small intestine with unspecified complications: Secondary | ICD-10-CM | POA: Diagnosis not present

## 2022-07-18 DIAGNOSIS — R933 Abnormal findings on diagnostic imaging of other parts of digestive tract: Secondary | ICD-10-CM | POA: Diagnosis not present

## 2022-07-18 DIAGNOSIS — R1030 Lower abdominal pain, unspecified: Secondary | ICD-10-CM | POA: Diagnosis not present

## 2022-07-18 DIAGNOSIS — K529 Noninfective gastroenteritis and colitis, unspecified: Secondary | ICD-10-CM | POA: Diagnosis not present

## 2022-07-18 DIAGNOSIS — R7982 Elevated C-reactive protein (CRP): Secondary | ICD-10-CM | POA: Diagnosis not present

## 2022-07-18 DIAGNOSIS — K5010 Crohn's disease of large intestine without complications: Secondary | ICD-10-CM | POA: Diagnosis not present

## 2022-08-01 DIAGNOSIS — N390 Urinary tract infection, site not specified: Secondary | ICD-10-CM | POA: Diagnosis not present

## 2022-08-01 DIAGNOSIS — N760 Acute vaginitis: Secondary | ICD-10-CM | POA: Diagnosis not present

## 2022-08-14 DIAGNOSIS — K529 Noninfective gastroenteritis and colitis, unspecified: Secondary | ICD-10-CM | POA: Diagnosis not present

## 2022-10-08 DIAGNOSIS — N760 Acute vaginitis: Secondary | ICD-10-CM | POA: Diagnosis not present

## 2023-03-14 DIAGNOSIS — L02425 Furuncle of right lower limb: Secondary | ICD-10-CM | POA: Diagnosis not present

## 2023-03-14 DIAGNOSIS — L02415 Cutaneous abscess of right lower limb: Secondary | ICD-10-CM | POA: Diagnosis not present

## 2023-04-02 DIAGNOSIS — Z1231 Encounter for screening mammogram for malignant neoplasm of breast: Secondary | ICD-10-CM | POA: Diagnosis not present

## 2023-05-24 DIAGNOSIS — K529 Noninfective gastroenteritis and colitis, unspecified: Secondary | ICD-10-CM | POA: Diagnosis not present

## 2023-05-24 DIAGNOSIS — R109 Unspecified abdominal pain: Secondary | ICD-10-CM | POA: Diagnosis not present

## 2023-05-24 DIAGNOSIS — R197 Diarrhea, unspecified: Secondary | ICD-10-CM | POA: Diagnosis not present

## 2023-05-24 DIAGNOSIS — E119 Type 2 diabetes mellitus without complications: Secondary | ICD-10-CM | POA: Diagnosis not present

## 2023-05-24 DIAGNOSIS — R112 Nausea with vomiting, unspecified: Secondary | ICD-10-CM | POA: Diagnosis not present

## 2023-06-30 DIAGNOSIS — N390 Urinary tract infection, site not specified: Secondary | ICD-10-CM | POA: Diagnosis not present

## 2023-07-08 DIAGNOSIS — M7712 Lateral epicondylitis, left elbow: Secondary | ICD-10-CM | POA: Diagnosis not present

## 2023-07-29 DIAGNOSIS — M7712 Lateral epicondylitis, left elbow: Secondary | ICD-10-CM | POA: Diagnosis not present

## 2023-08-24 DIAGNOSIS — E1140 Type 2 diabetes mellitus with diabetic neuropathy, unspecified: Secondary | ICD-10-CM | POA: Diagnosis not present

## 2023-08-24 DIAGNOSIS — E1169 Type 2 diabetes mellitus with other specified complication: Secondary | ICD-10-CM | POA: Diagnosis not present

## 2023-09-02 DIAGNOSIS — F419 Anxiety disorder, unspecified: Secondary | ICD-10-CM | POA: Diagnosis not present

## 2023-09-02 DIAGNOSIS — E785 Hyperlipidemia, unspecified: Secondary | ICD-10-CM | POA: Diagnosis not present

## 2023-09-02 DIAGNOSIS — Z6835 Body mass index (BMI) 35.0-35.9, adult: Secondary | ICD-10-CM | POA: Diagnosis not present

## 2023-09-02 DIAGNOSIS — E1169 Type 2 diabetes mellitus with other specified complication: Secondary | ICD-10-CM | POA: Diagnosis not present

## 2023-11-17 DIAGNOSIS — L732 Hidradenitis suppurativa: Secondary | ICD-10-CM | POA: Diagnosis not present

## 2024-03-16 ENCOUNTER — Other Ambulatory Visit: Payer: Self-pay | Admitting: Family Medicine

## 2024-03-16 ENCOUNTER — Ambulatory Visit
Admission: RE | Admit: 2024-03-16 | Discharge: 2024-03-16 | Disposition: A | Discharge status: Home / Self Care | Source: Ambulatory Visit | Attending: Family Medicine | Admitting: Family Medicine

## 2024-03-16 DIAGNOSIS — Z1231 Encounter for screening mammogram for malignant neoplasm of breast: Secondary | ICD-10-CM
